# Patient Record
Sex: Female | Born: 1935 | Race: White | Hispanic: No | Smoking: Never smoker
Health system: Southern US, Community
[De-identification: ages and names within clinical notes are randomized; demographics above are authoritative.]

## PROBLEM LIST (undated history)

## (undated) DIAGNOSIS — M545 Low back pain, unspecified: Secondary | ICD-10-CM

## (undated) DIAGNOSIS — M1990 Unspecified osteoarthritis, unspecified site: Secondary | ICD-10-CM

## (undated) DIAGNOSIS — F419 Anxiety disorder, unspecified: Secondary | ICD-10-CM

## (undated) DIAGNOSIS — C801 Malignant (primary) neoplasm, unspecified: Secondary | ICD-10-CM

## (undated) DIAGNOSIS — F329 Major depressive disorder, single episode, unspecified: Secondary | ICD-10-CM

## (undated) DIAGNOSIS — R51 Headache: Secondary | ICD-10-CM

## (undated) DIAGNOSIS — R233 Spontaneous ecchymoses: Secondary | ICD-10-CM

## (undated) DIAGNOSIS — R238 Other skin changes: Secondary | ICD-10-CM

## (undated) DIAGNOSIS — E785 Hyperlipidemia, unspecified: Secondary | ICD-10-CM

## (undated) DIAGNOSIS — K219 Gastro-esophageal reflux disease without esophagitis: Secondary | ICD-10-CM

## (undated) DIAGNOSIS — I10 Essential (primary) hypertension: Secondary | ICD-10-CM

## (undated) DIAGNOSIS — F32A Depression, unspecified: Secondary | ICD-10-CM

## (undated) DIAGNOSIS — R519 Headache, unspecified: Secondary | ICD-10-CM

## (undated) HISTORY — DX: Anxiety disorder, unspecified: F41.9

## (undated) HISTORY — DX: Hyperlipidemia, unspecified: E78.5

## (undated) HISTORY — PX: JOINT REPLACEMENT: SHX530

## (undated) HISTORY — DX: Gastro-esophageal reflux disease without esophagitis: K21.9

## (undated) HISTORY — PX: HIP ARTHROPLASTY: SHX981

## (undated) HISTORY — DX: Depression, unspecified: F32.A

## (undated) HISTORY — PX: BLADDER SURGERY: SHX569

## (undated) HISTORY — DX: Major depressive disorder, single episode, unspecified: F32.9

## (undated) HISTORY — PX: CATARACT EXTRACTION: SUR2

## (undated) HISTORY — PX: CHOLECYSTECTOMY: SHX55

---

## 2005-04-16 ENCOUNTER — Ambulatory Visit: Payer: Self-pay | Admitting: Internal Medicine

## 2006-01-20 ENCOUNTER — Ambulatory Visit: Payer: Self-pay | Admitting: Internal Medicine

## 2006-04-17 ENCOUNTER — Ambulatory Visit: Payer: Self-pay | Admitting: Internal Medicine

## 2006-07-28 ENCOUNTER — Ambulatory Visit: Payer: Self-pay | Admitting: Unknown Physician Specialty

## 2006-08-11 ENCOUNTER — Ambulatory Visit: Payer: Self-pay

## 2007-06-23 ENCOUNTER — Ambulatory Visit: Payer: Self-pay | Admitting: Internal Medicine

## 2008-06-23 ENCOUNTER — Ambulatory Visit: Payer: Self-pay | Admitting: Internal Medicine

## 2008-10-15 ENCOUNTER — Ambulatory Visit: Payer: Self-pay

## 2008-10-20 ENCOUNTER — Ambulatory Visit: Payer: Self-pay | Admitting: Internal Medicine

## 2008-11-08 ENCOUNTER — Ambulatory Visit: Payer: Self-pay | Admitting: Surgery

## 2008-11-15 ENCOUNTER — Ambulatory Visit: Payer: Self-pay | Admitting: Surgery

## 2008-11-28 ENCOUNTER — Ambulatory Visit: Payer: Self-pay | Admitting: Pain Medicine

## 2008-12-01 ENCOUNTER — Ambulatory Visit: Payer: Self-pay | Admitting: Pain Medicine

## 2008-12-15 ENCOUNTER — Ambulatory Visit: Payer: Self-pay | Admitting: Physician Assistant

## 2009-04-11 ENCOUNTER — Inpatient Hospital Stay (HOSPITAL_COMMUNITY): Admission: RE | Admit: 2009-04-11 | Discharge: 2009-04-13 | Payer: Self-pay | Admitting: Orthopedic Surgery

## 2009-08-30 ENCOUNTER — Ambulatory Visit: Payer: Self-pay | Admitting: Internal Medicine

## 2009-09-11 ENCOUNTER — Ambulatory Visit: Payer: Self-pay | Admitting: Unknown Physician Specialty

## 2010-09-14 ENCOUNTER — Ambulatory Visit: Payer: Self-pay | Admitting: Internal Medicine

## 2010-09-24 ENCOUNTER — Ambulatory Visit: Payer: Self-pay | Admitting: Unknown Physician Specialty

## 2010-10-11 ENCOUNTER — Ambulatory Visit: Payer: Self-pay | Admitting: Unknown Physician Specialty

## 2010-10-18 ENCOUNTER — Ambulatory Visit: Payer: Self-pay | Admitting: Unknown Physician Specialty

## 2010-10-26 LAB — CBC
HCT: 43.8 % (ref 36.0–46.0)
Hemoglobin: 10.1 g/dL — ABNORMAL LOW (ref 12.0–15.0)
Hemoglobin: 14.9 g/dL (ref 12.0–15.0)
MCHC: 33.8 g/dL (ref 30.0–36.0)
MCHC: 34.5 g/dL (ref 30.0–36.0)
MCHC: 33.9 g/dL (ref 30.0–36.0)
MCV: 96.6 fL (ref 78.0–100.0)
MCV: 95.6 fL (ref 78.0–100.0)
Platelets: 150 10*3/uL (ref 150–400)
Platelets: 251 10*3/uL (ref 150–400)
RBC: 3.03 MIL/uL — ABNORMAL LOW (ref 3.87–5.11)
RBC: 3.28 MIL/uL — ABNORMAL LOW (ref 3.87–5.11)
RBC: 4.59 MIL/uL (ref 3.87–5.11)
RDW: 14 % (ref 11.5–15.5)
WBC: 10.2 10*3/uL (ref 4.0–10.5)
WBC: 9.6 10*3/uL (ref 4.0–10.5)
WBC: 11.9 10*3/uL — ABNORMAL HIGH (ref 4.0–10.5)

## 2010-10-26 LAB — URINALYSIS, ROUTINE W REFLEX MICROSCOPIC
Glucose, UA: NEGATIVE mg/dL
Hgb urine dipstick: NEGATIVE
Specific Gravity, Urine: 1.019 (ref 1.005–1.030)
pH: 6 (ref 5.0–8.0)

## 2010-10-26 LAB — BASIC METABOLIC PANEL
BUN: 7 mg/dL (ref 6–23)
BUN: 9 mg/dL (ref 6–23)
BUN: 22 mg/dL (ref 6–23)
CO2: 28 mEq/L (ref 19–32)
CO2: 29 mEq/L (ref 19–32)
CO2: 25 mEq/L (ref 19–32)
Calcium: 7.8 mg/dL — ABNORMAL LOW (ref 8.4–10.5)
Calcium: 7.9 mg/dL — ABNORMAL LOW (ref 8.4–10.5)
Calcium: 9.4 mg/dL (ref 8.4–10.5)
Chloride: 103 mEq/L (ref 96–112)
Chloride: 104 mEq/L (ref 96–112)
Chloride: 103 mEq/L (ref 96–112)
Creatinine, Ser: 0.75 mg/dL (ref 0.4–1.2)
Creatinine, Ser: 0.8 mg/dL (ref 0.4–1.2)
Creatinine, Ser: 0.84 mg/dL (ref 0.4–1.2)
GFR calc Af Amer: >60 mL/min (ref >60)
GFR calc Af Amer: >60 mL/min (ref >60)
GFR calc Af Amer: >60 mL/min (ref >60)
GFR calc non Af Amer: >60 mL/min (ref >60)
GFR calc non Af Amer: >60 mL/min (ref >60)
GFR calc non Af Amer: >60 mL/min (ref >60)
Glucose, Bld: 123 mg/dL — ABNORMAL HIGH (ref 70–99)
Glucose, Bld: 126 mg/dL — ABNORMAL HIGH (ref 70–99)
Glucose, Bld: 128 mg/dL — ABNORMAL HIGH (ref 70–99)
Potassium: 3.3 mEq/L — ABNORMAL LOW (ref 3.5–5.1)
Potassium: 3.5 mEq/L (ref 3.5–5.1)
Potassium: 3.3 mEq/L — ABNORMAL LOW (ref 3.5–5.1)
Sodium: 135 mEq/L (ref 135–145)
Sodium: 136 mEq/L (ref 135–145)
Sodium: 139 mEq/L (ref 135–145)

## 2010-10-26 LAB — TYPE AND SCREEN
ABO/RH(D): O POS
Antibody Screen: NEGATIVE

## 2010-10-26 LAB — DIFFERENTIAL
Eosinophils Absolute: 0 10*3/uL (ref 0.0–0.7)
Lymphs Abs: 1.9 10*3/uL (ref 0.7–4.0)
Monocytes Absolute: 0.8 10*3/uL (ref 0.1–1.0)
Monocytes Relative: 7 % (ref 3–12)
Neutrophils Relative %: 77 % (ref 43–77)

## 2010-10-26 LAB — APTT: aPTT: 23 seconds — ABNORMAL LOW (ref 24–37)

## 2010-10-26 LAB — PROTIME-INR
INR: 0.9 (ref 0.00–1.49)
Prothrombin Time: 12.2 seconds (ref 11.6–15.2)

## 2010-10-26 LAB — ABO/RH: ABO/RH(D): O POS

## 2010-11-11 IMAGING — NM NUCLEAR MEDICINE HEPATOHBILIARY INCLUDE GB
2 series · 12 of 12 positions shown · non-contrast
Comparison: none

REASON FOR EXAM: RUQ pain eval gallbladder
COMMENTS:

[Series 1000: gallbladder dynamic (results) · 4.80mm/px · 6 of 60 frames shown]
[frame 6/60]
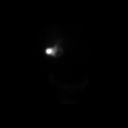
[frame 16/60]
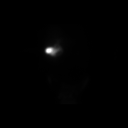
[frame 26/60]
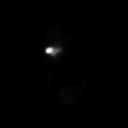
[frame 36/60]
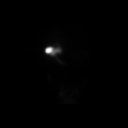
[frame 46/60]
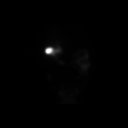
[frame 56/60]
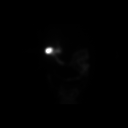

[Series 1000: gallbladder dynamic · 4.80mm/px · 6 of 60 frames shown]
[frame 6/60]
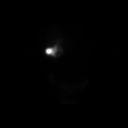
[frame 16/60]
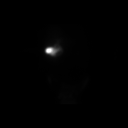
[frame 26/60]
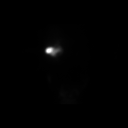
[frame 36/60]
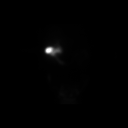
[frame 46/60]
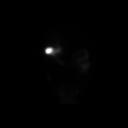
[frame 56/60]
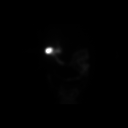

[12 of 12 positions shown; findings below may reference images not displayed]

PROCEDURE:     NM  - NM HEPATO WITH GB EJECT FRACTION  - October 20, 2008 [DATE]

RESULT:     The patient received an injection of 8.55 mCi of technetium 99m
Choletec. There is prompt extraction of tracer from the blood pool by the
liver. The common bile duct activity is seen by 10 minutes with small bowel
activity present by 15 minutes. Gallbladder localization begins at
approximately 30 minutes. There is progressive clearance of tracer from the
liver throughout the exam. The patient received an infusion of sincalide
with a total dose of 1.54 mcg administered intravenously over 30 minutes.
There is an abnormal, low gallbladder ejection fraction of 15%.
IMPRESSION: 1. Normal-appearing hepatobiliary scan.

2. Abnormal gallbladder ejection fraction in response to sincalide infusion.
The gallbladder ejection fraction is measured at 15%.

## 2010-12-12 ENCOUNTER — Ambulatory Visit: Payer: Self-pay | Admitting: Unknown Physician Specialty

## 2011-10-10 ENCOUNTER — Ambulatory Visit: Payer: Self-pay | Admitting: Internal Medicine

## 2012-04-10 ENCOUNTER — Ambulatory Visit: Payer: Self-pay | Admitting: Physical Medicine and Rehabilitation

## 2012-10-12 ENCOUNTER — Ambulatory Visit: Payer: Self-pay | Admitting: Internal Medicine

## 2013-07-26 ENCOUNTER — Ambulatory Visit: Payer: Self-pay | Admitting: Internal Medicine

## 2013-10-05 ENCOUNTER — Ambulatory Visit: Payer: Self-pay | Admitting: General Practice

## 2013-10-05 LAB — URINALYSIS, COMPLETE
BILIRUBIN, UR: NEGATIVE
Blood: NEGATIVE
Glucose,UR: NEGATIVE mg/dL (ref 0–75)
Ketone: NEGATIVE
Nitrite: NEGATIVE
PH: 5 (ref 4.5–8.0)
Protein: NEGATIVE
RBC,UR: 3 /HPF (ref 0–5)
Specific Gravity: 1.008 (ref 1.003–1.030)
Squamous Epithelial: <1

## 2013-10-05 LAB — CBC
HCT: 45.6 % (ref 35.0–47.0)
HGB: 15.3 g/dL (ref 12.0–16.0)
MCH: 30.8 pg (ref 26.0–34.0)
MCHC: 33.5 g/dL (ref 32.0–36.0)
MCV: 92 fL (ref 80–100)
Platelet: 170 10*3/uL (ref 150–440)
RBC: 4.97 10*6/uL (ref 3.80–5.20)
RDW: 13.9 % (ref 11.5–14.5)
WBC: 8.8 10*3/uL (ref 3.6–11.0)

## 2013-10-05 LAB — MRSA PCR SCREENING

## 2013-10-05 LAB — SEDIMENTATION RATE: ERYTHROCYTE SED RATE: 1 mm/h (ref 0–30)

## 2013-10-07 LAB — URINE CULTURE

## 2013-10-13 ENCOUNTER — Ambulatory Visit: Payer: Self-pay | Admitting: Internal Medicine

## 2013-10-20 ENCOUNTER — Inpatient Hospital Stay: Payer: Self-pay | Admitting: General Practice

## 2013-10-21 LAB — BASIC METABOLIC PANEL
ANION GAP: 4 — AB (ref 7–16)
BUN: 12 mg/dL (ref 7–18)
CHLORIDE: 105 mmol/L (ref 98–107)
CREATININE: 0.96 mg/dL (ref 0.60–1.30)
Calcium, Total: 8.1 mg/dL — ABNORMAL LOW (ref 8.5–10.1)
Co2: 28 mmol/L (ref 21–32)
GFR CALC NON AF AMER: 56 — AB
Glucose: 118 mg/dL — ABNORMAL HIGH (ref 65–99)
Osmolality: 275 (ref 275–301)
POTASSIUM: 4.1 mmol/L (ref 3.5–5.1)
SODIUM: 137 mmol/L (ref 136–145)

## 2013-10-21 LAB — PLATELET COUNT: PLATELETS: 142 10*3/uL — AB (ref 150–440)

## 2013-10-21 LAB — HEMOGLOBIN: HGB: 12.1 g/dL (ref 12.0–16.0)

## 2013-10-22 LAB — BASIC METABOLIC PANEL
ANION GAP: 5 — AB (ref 7–16)
BUN: 13 mg/dL (ref 7–18)
CREATININE: 0.97 mg/dL (ref 0.60–1.30)
Calcium, Total: 8.2 mg/dL — ABNORMAL LOW (ref 8.5–10.1)
Chloride: 106 mmol/L (ref 98–107)
Co2: 27 mmol/L (ref 21–32)
EGFR (African American): >60
EGFR (Non-African Amer.): 56 — ABNORMAL LOW
GLUCOSE: 110 mg/dL — AB (ref 65–99)
OSMOLALITY: 276 (ref 275–301)
POTASSIUM: 3.8 mmol/L (ref 3.5–5.1)
Sodium: 138 mmol/L (ref 136–145)

## 2013-10-22 LAB — HEMOGLOBIN: HGB: 11.6 g/dL — AB (ref 12.0–16.0)

## 2013-10-22 LAB — PLATELET COUNT: PLATELETS: 127 10*3/uL — AB (ref 150–440)

## 2013-12-20 ENCOUNTER — Ambulatory Visit: Payer: Self-pay | Admitting: General Practice

## 2013-12-30 DIAGNOSIS — M48062 Spinal stenosis, lumbar region with neurogenic claudication: Secondary | ICD-10-CM | POA: Insufficient documentation

## 2014-01-07 ENCOUNTER — Ambulatory Visit: Payer: Self-pay | Admitting: Physical Medicine and Rehabilitation

## 2014-01-11 DIAGNOSIS — M1990 Unspecified osteoarthritis, unspecified site: Secondary | ICD-10-CM | POA: Insufficient documentation

## 2014-06-22 ENCOUNTER — Ambulatory Visit: Payer: Self-pay | Admitting: General Practice

## 2014-06-22 LAB — BASIC METABOLIC PANEL
Anion Gap: 4 — ABNORMAL LOW (ref 7–16)
BUN: 16 mg/dL (ref 7–18)
CO2: 31 mmol/L (ref 21–32)
Calcium, Total: 9.1 mg/dL (ref 8.5–10.1)
Chloride: 102 mmol/L (ref 98–107)
Creatinine: 0.89 mg/dL (ref 0.60–1.30)
EGFR (African American): >60
EGFR (Non-African Amer.): >60
GLUCOSE: 101 mg/dL — AB (ref 65–99)
Osmolality: 275 (ref 275–301)
Potassium: 3.4 mmol/L — ABNORMAL LOW (ref 3.5–5.1)
Sodium: 137 mmol/L (ref 136–145)

## 2014-06-22 LAB — URINALYSIS, COMPLETE
Bacteria: NONE SEEN
Bilirubin,UR: NEGATIVE
Blood: NEGATIVE
Glucose,UR: NEGATIVE mg/dL (ref 0–75)
KETONE: NEGATIVE
LEUKOCYTE ESTERASE: NEGATIVE
Nitrite: NEGATIVE
PH: 5 (ref 4.5–8.0)
Protein: NEGATIVE
SPECIFIC GRAVITY: 1.005 (ref 1.003–1.030)
WBC UR: <1 /HPF (ref 0–5)

## 2014-06-22 LAB — CBC
HCT: 46.9 % (ref 35.0–47.0)
HGB: 15.1 g/dL (ref 12.0–16.0)
MCH: 29.8 pg (ref 26.0–34.0)
MCHC: 32.1 g/dL (ref 32.0–36.0)
MCV: 93 fL (ref 80–100)
PLATELETS: 188 10*3/uL (ref 150–440)
RBC: 5.05 10*6/uL (ref 3.80–5.20)
RDW: 14.2 % (ref 11.5–14.5)
WBC: 6.4 10*3/uL (ref 3.6–11.0)

## 2014-06-22 LAB — APTT: ACTIVATED PTT: 28 s (ref 23.6–35.9)

## 2014-06-22 LAB — PROTIME-INR
INR: 0.9
Prothrombin Time: 12.2 secs (ref 11.5–14.7)

## 2014-06-22 LAB — SEDIMENTATION RATE: ERYTHROCYTE SED RATE: 8 mm/h (ref 0–30)

## 2014-06-22 LAB — MRSA PCR SCREENING

## 2014-06-23 LAB — URINE CULTURE

## 2014-07-04 ENCOUNTER — Inpatient Hospital Stay: Payer: Self-pay | Admitting: General Practice

## 2014-07-05 LAB — BASIC METABOLIC PANEL
Anion Gap: 8 (ref 7–16)
BUN: 13 mg/dL (ref 7–18)
Calcium, Total: 7.7 mg/dL — ABNORMAL LOW (ref 8.5–10.1)
Chloride: 104 mmol/L (ref 98–107)
Co2: 25 mmol/L (ref 21–32)
Creatinine: 1.19 mg/dL (ref 0.60–1.30)
EGFR (African American): 56 — ABNORMAL LOW
GFR CALC NON AF AMER: 47 — AB
Glucose: 102 mg/dL — ABNORMAL HIGH (ref 65–99)
Osmolality: 274 (ref 275–301)
POTASSIUM: 3.6 mmol/L (ref 3.5–5.1)
SODIUM: 137 mmol/L (ref 136–145)

## 2014-07-05 LAB — PLATELET COUNT: PLATELETS: 132 10*3/uL — AB (ref 150–440)

## 2014-07-05 LAB — HEMOGLOBIN: HGB: 11.1 g/dL — AB (ref 12.0–16.0)

## 2014-07-06 LAB — BASIC METABOLIC PANEL
Anion Gap: 5 — ABNORMAL LOW (ref 7–16)
BUN: 10 mg/dL (ref 7–18)
CALCIUM: 7.7 mg/dL — AB (ref 8.5–10.1)
Chloride: 109 mmol/L — ABNORMAL HIGH (ref 98–107)
Co2: 24 mmol/L (ref 21–32)
Creatinine: 0.76 mg/dL (ref 0.60–1.30)
EGFR (African American): >60
EGFR (Non-African Amer.): >60
Glucose: 100 mg/dL — ABNORMAL HIGH (ref 65–99)
OSMOLALITY: 275 (ref 275–301)
Potassium: 3.7 mmol/L (ref 3.5–5.1)
Sodium: 138 mmol/L (ref 136–145)

## 2014-07-06 LAB — PLATELET COUNT: PLATELETS: 117 10*3/uL — AB (ref 150–440)

## 2014-07-06 LAB — HEMOGLOBIN: HGB: 11.2 g/dL — AB (ref 12.0–16.0)

## 2014-09-15 DIAGNOSIS — G2581 Restless legs syndrome: Secondary | ICD-10-CM | POA: Insufficient documentation

## 2014-11-12 NOTE — Op Note (Signed)
PATIENT NAME:  Traci [name], MILLINER MR#:  [id number] DATE OF BIRTH:  [birth year]  DATE OF PROCEDURE:  07/04/2014  PREOPERATIVE DIAGNOSIS: Degenerative arthrosis of the right hip (primary).   POSTOPERATIVE DIAGNOSIS: Degenerative arthrosis of the right hip (primary).   PROCEDURE PERFORMED: Right total hip arthroplasty.   SURGEON: Laurice Record. Holley Bouche., MD   ASSISTANT: Vance Peper, PA (required to maintain retraction throughout the procedure).   ANESTHESIA: Spinal.   ESTIMATED BLOOD LOSS: 270 mL   FLUIDS REPLACED: 1800 mL of crystalloid.   DRAINS: Two medium drains to Hemovac reservoir.   IMPLANTS UTILIZED: DePuy 15 mm small stature AML femoral stem, 52 mm outer diameter Pinnacle 100 acetabular shell, neutral Pinnacle AltrX polyethylene liner, and a 36 mm M-SPEC femoral head with a -2 mm neck length.   INDICATIONS FOR SURGERY: The patient is a 81 year old female who has been seen for complaints of severe progressive right hip and groin pain. X-rays demonstrated significant degenerative changes. After discussion of the risks and benefits of surgical intervention, the patient expressed understanding of the risks and benefits, and agreed with plans for surgical intervention.   PROCEDURE IN DETAIL: The patient was brought into the Operating Room and, after adequate spinal anesthesia was achieved, the patient was placed in the left lateral decubitus position. Axillary roll was placed, and all bony prominences were well padded. The patient's right hip and leg were cleaned and prepped with alcohol and DuraPrep and draped in the usual sterile fashion. A "timeout" was performed as per usual protocol.   A lateral curvilinear incision was made, gently curving towards the posterior superior iliac spine. IT band was incised in line with the skin incision. Fibers of the gluteus maximus were split in line. Piriformis tendon was identified, skeletonized, and incised at its insertion on the proximal femur and  reflected posteriorly. In a similar fashion, the short external rotators were incised and reflected posteriorly. A T-type posterior capsulotomy was performed. Prior to dislocation of the femoral head, a threaded Steinmann pin was inserted through a separate stab incision into the pelvis superior to the acetabulum in the form of a stylus so as to assess limb length and hip offset throughout the procedure.   The femoral head was then dislocated posteriorly. Severe degenerative changes were noted most notably to the superior aspect. A femoral neck cut was performed using an oscillating saw. The anterior capsule was elevated off the femoral neck. Inspection of the acetabulum also demonstrated degenerative changes. The remnant of the labrum was excised using electrocautery. The acetabulum was reamed in a sequential fashion up to a 51 mm diameter. This allowed for good punctate bleeding bone. A 52 mm outer diameter Pinnacle 100 acetabular component was positioned and impacted into place. Good scratch fit was appreciated. A +4 mm neutral polyethylene trial was inserted, and attention was directed to the proximal femur. Pilot hole for reaming of the proximal femoral canal was created using a high speed bur. Proximal femoral canal was reamed in a sequential fashion up to a 14.5 mm diameter. This allowed for approximately 6 cm of scratch fit. The proximal femur was prepared using a 15 mm aggressive side-biting reamer. Serial broaches were inserted up to a 15 mm small stature AML broach. The calcar region was planed and trial reduction was performed with a 36 mm hip ball with +1.5 mm neck length. Significant increase in hip offset was noted. The +4 mm neutral polyethylene trial was replaced with a neutral polyethylene trial, and the head was  replaced with a 36 mm trial hip ball with a -2 mm neck length. This allowed for good equalization of limb lengths and improved hip offset. Excellent stability was noted both anteriorly  and posteriorly.   Trial components were removed. The acetabular shell was irrigated and suctioned dry. A neutral Pinnacle AltrX polyethylene liner was positioned and impacted into place. Next, a 15 mm small stature AML femoral component was positioned and impacted into place. Excellent scratch fit was appreciated. Trial reduction was again performed with a 36 mm hip ball with a -2 mm neck length. Again, excellent stability was appreciated both anteriorly and posteriorly, and appropriate hip offset and limb length was noted. Trial hip ball was removed. The Morse taper was cleaned and dried. A 36 mm M-SPEC femoral head with a -2 mm neck length was placed on the trunnion and impacted into place. The hip was reduced and placed through a range of motion, again with excellent stability and good equalization of limb lengths.   The wound was irrigated with copious amounts of normal saline with antibiotic solution using pulsatile lavage and then suctioned dry. Good hemostasis was appreciated. The posterior capsulotomy was repaired using #5 Ethibond. The piriformis tendon was reapproximated on the undersurface of the gluteus medius tendon using #5 Ethibond. Two medium drains were placed in the wound bed and brought out through a separate stab incision to be attached to a Hemovac reservoir. The IT band was repaired using interrupted sutures of #1 Vicryl. The subcutaneous tissue was approximated in layers using first #0 Vicryl followed by #2-0 Vicryl. Skin was closed with skin staples. A sterile dressing was applied.   The patient tolerated the procedure well. She was transported to the recovery room in stable condition.     ____________________________ Laurice Record. Holley Bouche., MD jph:MT D: 07/05/2014 06:22:00 ET T: 07/05/2014 11:05:19 ET JOB#: [id number]  cc: Laurice Record. Holley Bouche., MD, <Dictator> Laurice Record Holley Bouche MD ELECTRONICALLY SIGNED 07/05/2014 19:47

## 2014-11-12 NOTE — Op Note (Signed)
PATIENT NAME:  Traci [name], Traci [name] MR#:  [id number] DATE OF BIRTH:  August 25, 1932  DATE OF PROCEDURE:  10/20/2013  PREOPERATIVE DIAGNOSIS: Degenerative arthrosis of the right knee.   POSTOPERATIVE DIAGNOSIS: Degenerative arthrosis of the right knee.   PROCEDURE PERFORMED: Right total knee arthroplasty using computer-assisted navigation.   SURGEON: Laurice Record. Hooten, M.D.   ASSISTANT:  Vance Peper, PA (required to maintain retraction throughout the procedure).  ANESTHESIA: General.   ESTIMATED BLOOD LOSS: 75 mL.   FLUIDS REPLACED: 1800 mL of crystalloid.   TOURNIQUET TIME: 83 minutes.   DRAINS: Two medium drains to reinfusion system.   SOFT TISSUE RELEASES: Anterior cruciate ligament, posterior cruciate ligament, deep medial collateral ligament and patellofemoral ligament.   IMPLANTS UTILIZED: DePuy PFC Sigma size 4N (narrow) posterior stabilized femoral component (cemented), size 4 MBT tibial component (cemented), 32 mm 3-peg oval dome patella (cemented) and a 10 mm stabilized rotating platform polyethylene insert.   INDICATIONS FOR SURGERY: The patient is a 81 year old female who has been seen for complaints of progressive right knee pain. X-rays demonstrated severe degenerative changes in a tricompartmental fashion with relative valgus deformity. After discussion of the risks and benefits of surgical intervention, the patient expressed understanding of the risks and benefits, and agreed with plans for surgical intervention.   PROCEDURE IN DETAIL: The patient was brought to the Operating Room and adequate general anesthesia was achieved after failed attempts at spinal. A tourniquet was placed on the patient's upper right thigh and leg was cleaned and prepped with alcohol and DuraPrep and draped in the usual sterile fashion. A "timeout" was performed as per usual protocol. The right lower extremity was exsanguinated using an Esmarch and the tourniquet was inflated to 300 mmHg.   An anterior  longitudinal incision was made followed by a standard mid vastus approach. A small effusion was evacuated. The deep fibers of the medial collateral ligament were elevated in a subperiosteal fashion off the medial flare of the tibia so as maintain a continuous soft tissue sleeve. The patella was subluxed laterally and the patellofemoral ligament was incised. Inspection of the knee demonstrated severe degenerative changes with full-thickness loss of articular cartilage to the lateral compartment. Prominent osteophytes were debrided using a rongeur. Anterior and posterior cruciate ligaments were excised. Two 4.0 mm Schanz pins were inserted into the femur and into the tibia for attachment of the ray of trackers used for computer-assisted navigation. Hip center was identified using circumduction technique. Distal landmarks were mapped using the computer. The distal femur and proximal tibia were mapped using the computer. Distal femoral cutting guide was positioned using computer-assisted navigation so as to achieve a 5 degree distal valgus cut. Cut was performed and verified using the computer. Distal femur was sized and it was felt that a size 4N (narrow) size was appropriate. A size 4 cutting guide was positioned and the anterior cut was performed and verified using the computer. This was followed by completion of the posterior and chamfer cuts. Femoral cutting guide for the central box was then positioned and the central box cut was performed.   Next, attention was directed to the proximal tibia. Medial and lateral menisci were excised. The extramedullary tibial cutting guide was positioned using computer-assisted navigation so as to achieve a 0 degree varus-valgus alignment and 0 degree posterior slope. Cut was performed and verified using the computer. The proximal tibia was sized and it was felt that a size 4 tibial tray was appropriate. Tibial and femoral trials were inserted, followed  by insertion of a 10 mm  polyethylene trial. Excellent mediolateral soft tissue balancing was appreciated both in full extension and in flexion. Finally, the patella was cut and prepared so as to accommodate a 32 mm, 3-peg oval dome patella. Patellar trial was placed and the knee was placed through range of motion with excellent patellar tracking appreciated. The femoral trial was removed after debridement of posterior osteophytes. Central post hole for the tibial component was reamed followed by insertion of a keel punch. Tibial trial was then removed. The cut surfaces of bone were irrigated with copious amounts of normal saline with antibiotic solution using pulsatile lavage and then suctioned dry. Polymethyl methacrylate cement was prepared in the usual fashion using a vacuum mixer. Cement was applied to the cut surface of the proximal tibia as well as along the undersurface of a size 4 MBT tibial component. The tibial component was positioned and impacted into place. Excess cement was removed using [employer] fast streamer. Cement was then applied to the cut surface of the femur as well as along the posterior flanges of a size  4N (narrow) posterior stabilized femoral component. Femoral component was positioned and impacted into place. Excess cement was removed using [employer] fast streamer. A 10 mm polyethylene trial was inserted and the knee was brought in full extension with steady axial compression applied. Finally, cement was applied to the backside of a 32 mm, 3-peg oval dome patella and the patellar component was positioned and patellar clamp applied. Excess cement was removed using [employer] fast streamer.   After adequate curing of cement, the tourniquet was deflated after a total tourniquet time of 83 minutes. Hemostasis was achieved using electrocautery. The knee was irrigated with copious amounts of normal saline with antibiotic solution using pulsatile lavage and then suctioned dry. The knee was inspected for any residual cement debris. Then 20  mL of 1.3% Exparel in 40 mL of normal saline was injected along the posterior capsule, medial and lateral gutters, and along the arthrotomy site. A 10 mm stabilized rotating platform polyethylene insert was inserted and the knee was placed through a range of motion. Excellent mediolateral soft tissue balancing was appreciated. Excellent patellar tracking was noted. Two medium drains were placed in the wound bed and brought out through a separate stab incision to be attached to a reinfusion system. The medial parapatellar portion of the incision was reapproximated using interrupted sutures of #1 Vicryl. The subcutaneous tissue was approximated in layers using first #0 Vicryl followed by #2-0 Vicryl. Then 30 mL of 0.25% Marcaine with epinephrine was injected in the subcutaneous tissue in line with the skin incision. Skin was reapproximated using skin staples. A sterile dressing was applied.   The patient tolerated the procedure well. She was transported to the recovery room in stable condition.   ____________________________ Laurice Record. Holley Bouche., MD jph:cs D: 10/20/2013 15:42:03 ET T: 10/20/2013 19:46:21 ET JOB#: [id number]  cc: Jeneen Rinks P. Holley Bouche., MD, <Dictator> Laurice Record Holley Bouche MD ELECTRONICALLY SIGNED 10/21/2013 6:49

## 2014-11-12 NOTE — Discharge Summary (Signed)
PATIENT NAME:  Traci [name], Traci [name] MR#:  [id number] DATE OF BIRTH:  09-07-1932  DATE OF ADMISSION:  10/20/2013 DATE OF DISCHARGE:  10/23/2013   ADMITTING DIAGNOSIS: Degenerative arthrosis of the right knee.   DISCHARGE DIAGNOSIS: Degenerative arthrosis of the right knee.   OPERATION: On 10/20/2013, the patient had a right total knee arthroplasty using computer-aided navigation.   SURGEON: Laurice Record. Holley Bouche., MD  ASSISTANT: Vance Peper, PA   ANESTHESIA: General.   ESTIMATED BLOOD LOSS: 75 mL.   TOURNIQUET TIME: 83 minutes.   IMPLANTS USED: DePuy PFC Sigma size 4N narrow posterior stabilized femoral component that was cemented, size 4 MBT tibial component that was cemented, 32 mm 3 peg oval dome patella that was cemented, and a 10 mm stabilized rotating platform polyethylene insert.  The patient was stabilized and brought to the recovery room.   HISTORY: The patient is a 81 year old female who presented for an upcoming right total knee replacement. The patient has a long history of progressive pain and has been refractory to conservative treatment. The patient has tried cortisone injections, Synvisc injections and activity modification. The patient has been refractory to this care.   PHYSICAL EXAMINATION:  GENERAL: Alert female in no apparent distress. Ambulating with a valgus thrust to the right knee and an antalgic gait.  LUNGS: Clear to auscultation.  CARDIOVASCULAR: Regular rate and rhythm.  MUSCULOSKELETAL: In regard to the right lower extremity, the patient has mild effusion. The patient has lateral joint line tenderness. The patient has full extension to 122 degrees of flexion. The patient has good quadriceps control.   HOSPITAL COURSE: After initial admission on 10/20/2013, the patient was brought to the orthopedic floor. On postoperative day 1, the patient had a hemoglobin of 12.1, which dropped down to around 11.6 on postoperative day 2. The patient had an Autovac initially,  which was discontinued, and the patient tolerated this well. The patient worked with physical therapy, initially bed to chair and progressed up to ambulating 275 feet, including the stairs on the day before discharge. The patient was to go home based on her parameters. The patient did well and was ready to go home with no complication.   CONDITION AT DISCHARGE: Stable.   DISPOSITION: The patient was sent home with home health physical therapy.   DISCHARGE INSTRUCTIONS: The patient will follow up with [facility] on 11/04/2013. The patient will do weight bear as tolerated. The patient will raise her affected leg on 1 to 2 pillows on the bed. The patient will use thigh-high TED hose on both legs,  removed 1 hour every 8-hour shift. The patient will elevate her heels off the bed and do incentive spirometer while awake. The patient will be encouraged to do cough and deep breathing. The patient's diet is regular. The patient will use the Polar Care to decrease swelling. The patient will have a dressing change on a p.r.n. basis. The patient will call the clinic if there is any bright red bleeding, calf pain, bowel or bladder difficulty or fever greater than 101.5. The patient will work with home health physical therapy, working on gait training and range of motion activities.   DISCHARGE MEDICATIONS: Resume home medication and to add oxycodone 5 mg 1 to 2 tablets every 4 hours as needed for severe pain, Tylenol 500 mg 1 tablet every 4 hours as needed for pain or fever greater than 100.4, Celebrex 200 mg 1 capsule b.i.d., Lovenox 30 mg 1 injection daily for 14 days, then  discontinue and start on aspirin 81 mg once a day.    ____________________________ J. Reche Dixon, [place] jtm:lb D: 10/23/2013 50:41:36 ET T: 10/23/2013 06:49:00 ET JOB#: [id number]  cc: J. Reche Dixon, [place], <Dictator> J Kelisha Dall [facility] PA ELECTRONICALLY SIGNED 11/03/2013 6:37

## 2014-11-12 NOTE — Discharge Summary (Signed)
PATIENT NAME:  Traci, [name] MR#:  [id number] DATE OF BIRTH:  Jul 02, 1933  DATE OF ADMISSION:  07/04/2014 DATE OF DISCHARGE:  07/07/2014  ADMITTING DIAGNOSIS: Degenerative arthrosis of the right hip.   DISCHARGE DIAGNOSIS: Degenerative arthrosis of the right hip.   OPERATION: On 07/04/2014, the patient had a right total hip arthroplasty.   SURGEON: Laurice Record. Hooten, MD.   ASSISTANT: Vance Peper, PA.   ANESTHESIA: Spinal.   IMPLANTS USED: DePuy 15 mm small stature AML femoral stem, 52 mm outer diameter Pinnacle 100 acetabular shell, neutral Pinnacle AltrX polyethylene liner and a 36 mm M-Spec femoral head with -2 mm neck length. The patient was stabilized, brought to the recovery room, and then brought down to the orthopedic floor.   HISTORY OF PRESENT ILLNESS: The patient is a 81 year old female who presented for upcoming total hip replacement. The patient has had persistent pain and had difficulty with activities of daily living. The patient has had cortisone injections as well as activity modification, nonsteroidals with no relief of symptoms.   PHYSICAL EXAMINATION: GENERAL: Alert female with a slight antalgic gait and hip lurch on the right.  CARDIOVASCULAR: Regular rate and rhythm with no murmur.  LUNGS: Clear to auscultation.  MUSCULOSKELETAL: In regards to the right hip, the patient has no soft tissue swelling. The patient does have tenderness on the anterior hip joint, with discomfort with axial compression test and the extremes of motion. The patient has range of motion of full extension to 100 degrees flexion with 20 degrees abduction and neutral internal rotation with 30 degrees external rotation. NEUROVASCULAR: Essentially intact.   HOSPITAL COURSE: After initial admission on 07/04/2014, the patient was brought to the orthopedic floor. On postop day 1, the patient had a hemoglobin of 11.1, which remained stable to 11.2 on postop day 2. The patient worked with physical therapy.  Initially, bed to chair and progressed up to ambulating up to 300 feet, including stairs. The patient did well enough to go home on 07/07/2014.   CONDITION AT DISCHARGE: Stable.   DISPOSITION: The patient was sent home with home health physical therapy.   DISCHARGE INSTRUCTIONS: The patient will follow up on 08/16/2014 with Dr. Marry Guan. The patient will do weight bear as tolerated on the affected leg and use a walker. The patient will elevate her leg with 1 to 2 pillows to decrease swelling. The patient will use posterior hip precautions. The patient will use knee-high TED hose on both legs, removed at bedtime, and placed on again in the morning. The patient will elevate her heels off the bed and use incentive spirometer as well as be encouraged to do cough and deep breathing. The patient does have a diabetic diet. The patient will keep her dressing clean and dry and have it changed as needed with physical therapy. The patient will have the staples removed on December 28th with physical therapy. The patient will call the clinic if there is any bright red bleeding, calf pain, bowel or bladder difficulty, or any fever greater than 101.5.   DISCHARGE MEDICATIONS: Resume home medications and to add Lovenox 40 mg subcutaneous once a day for 14 days and then start aspirin 81 mg once a day, as well as Tylenol 500 mg 1 tablet every 4 hours as needed for fever, and oxycodone 5 mg 1 to 2 tablets every 4 hours as needed for severe pain. She is to use tramadol and will return to using this intermittently with oxycodone for milder pain.  ____________________________ Lenna Sciara. Reche Dixon, [place] jtm:mw D: 07/07/2014 06:29:16 ET T: 07/07/2014 11:33:54 ET JOB#: [id number]  cc: J. Reche Dixon, [place], <Dictator> J Jiah Bari [facility] PA ELECTRONICALLY SIGNED 07/08/2014 9:59

## 2014-11-14 LAB — SURGICAL PATHOLOGY

## 2014-11-28 ENCOUNTER — Other Ambulatory Visit: Payer: Self-pay

## 2014-11-28 DIAGNOSIS — Z1231 Encounter for screening mammogram for malignant neoplasm of breast: Secondary | ICD-10-CM

## 2014-12-29 ENCOUNTER — Other Ambulatory Visit: Payer: Self-pay

## 2014-12-29 ENCOUNTER — Other Ambulatory Visit: Payer: Self-pay | Admitting: Internal Medicine

## 2014-12-29 ENCOUNTER — Ambulatory Visit
Admission: RE | Admit: 2014-12-29 | Discharge: 2014-12-29 | Disposition: A | Discharge status: Home / Self Care | Payer: Medicare Other | Source: Ambulatory Visit | Attending: Internal Medicine | Admitting: Internal Medicine

## 2014-12-29 DIAGNOSIS — Z1231 Encounter for screening mammogram for malignant neoplasm of breast: Secondary | ICD-10-CM | POA: Diagnosis present

## 2014-12-29 HISTORY — DX: Malignant (primary) neoplasm, unspecified: C80.1

## 2015-03-06 ENCOUNTER — Other Ambulatory Visit: Payer: Self-pay | Admitting: Internal Medicine

## 2015-03-06 DIAGNOSIS — R1084 Generalized abdominal pain: Secondary | ICD-10-CM

## 2015-03-10 ENCOUNTER — Ambulatory Visit
Admission: RE | Admit: 2015-03-10 | Discharge: 2015-03-10 | Disposition: A | Discharge status: Home / Self Care | Payer: Medicare Other | Source: Ambulatory Visit | Attending: Internal Medicine | Admitting: Internal Medicine

## 2015-03-10 DIAGNOSIS — R1084 Generalized abdominal pain: Secondary | ICD-10-CM

## 2015-03-10 HISTORY — DX: Essential (primary) hypertension: I10

## 2015-03-10 MED ORDER — IOHEXOL 300 MG/ML  SOLN
100.0000 mL | Freq: Once | INTRAMUSCULAR | Status: AC | PRN
  Administered 2015-03-10: 80 mL via INTRAVENOUS

## 2015-04-19 DIAGNOSIS — K2950 Unspecified chronic gastritis without bleeding: Secondary | ICD-10-CM | POA: Insufficient documentation

## 2015-12-08 ENCOUNTER — Other Ambulatory Visit: Payer: Self-pay | Admitting: Internal Medicine

## 2015-12-08 DIAGNOSIS — Z1231 Encounter for screening mammogram for malignant neoplasm of breast: Secondary | ICD-10-CM

## 2016-01-01 ENCOUNTER — Other Ambulatory Visit: Payer: Self-pay | Admitting: Internal Medicine

## 2016-01-01 ENCOUNTER — Ambulatory Visit
Admission: RE | Admit: 2016-01-01 | Discharge: 2016-01-01 | Disposition: A | Discharge status: Home / Self Care | Payer: Medicare Other | Source: Ambulatory Visit | Attending: Internal Medicine | Admitting: Internal Medicine

## 2016-01-01 DIAGNOSIS — Z1231 Encounter for screening mammogram for malignant neoplasm of breast: Secondary | ICD-10-CM | POA: Diagnosis present

## 2016-04-10 DIAGNOSIS — Z0000 Encounter for general adult medical examination without abnormal findings: Secondary | ICD-10-CM | POA: Insufficient documentation

## 2016-04-16 DIAGNOSIS — N8110 Cystocele, unspecified: Secondary | ICD-10-CM | POA: Insufficient documentation

## 2016-12-31 ENCOUNTER — Other Ambulatory Visit: Payer: Self-pay | Admitting: Nurse Practitioner

## 2016-12-31 DIAGNOSIS — R1319 Other dysphagia: Secondary | ICD-10-CM

## 2017-01-06 ENCOUNTER — Ambulatory Visit
Admission: RE | Admit: 2017-01-06 | Discharge: 2017-01-06 | Disposition: A | Discharge status: Home / Self Care | Payer: Medicare Other | Source: Ambulatory Visit | Attending: Nurse Practitioner | Admitting: Nurse Practitioner

## 2017-01-06 DIAGNOSIS — K228 Other specified diseases of esophagus: Secondary | ICD-10-CM | POA: Diagnosis not present

## 2017-01-06 DIAGNOSIS — R1310 Dysphagia, unspecified: Secondary | ICD-10-CM | POA: Diagnosis present

## 2017-01-06 DIAGNOSIS — M2578 Osteophyte, vertebrae: Secondary | ICD-10-CM | POA: Diagnosis not present

## 2017-01-06 DIAGNOSIS — R1319 Other dysphagia: Secondary | ICD-10-CM

## 2017-01-16 ENCOUNTER — Other Ambulatory Visit: Payer: Self-pay | Admitting: Internal Medicine

## 2017-01-16 DIAGNOSIS — Z1231 Encounter for screening mammogram for malignant neoplasm of breast: Secondary | ICD-10-CM

## 2017-02-12 ENCOUNTER — Ambulatory Visit
Admission: RE | Admit: 2017-02-12 | Discharge: 2017-02-12 | Disposition: A | Discharge status: Home / Self Care | Payer: Medicare Other | Source: Ambulatory Visit | Attending: Internal Medicine | Admitting: Internal Medicine

## 2017-02-12 DIAGNOSIS — Z1231 Encounter for screening mammogram for malignant neoplasm of breast: Secondary | ICD-10-CM | POA: Insufficient documentation

## 2017-02-13 DIAGNOSIS — I10 Essential (primary) hypertension: Secondary | ICD-10-CM | POA: Insufficient documentation

## 2017-03-18 ENCOUNTER — Ambulatory Visit (INDEPENDENT_AMBULATORY_CARE_PROVIDER_SITE_OTHER): Payer: Medicare Other | Admitting: Urology

## 2017-03-18 ENCOUNTER — Encounter: Payer: Self-pay | Admitting: Urology

## 2017-03-18 VITALS — BP 163/98 | HR 96 | Ht 64.0 in | Wt 132.0 lb

## 2017-03-18 DIAGNOSIS — N952 Postmenopausal atrophic vaginitis: Secondary | ICD-10-CM

## 2017-03-18 DIAGNOSIS — N390 Urinary tract infection, site not specified: Secondary | ICD-10-CM | POA: Diagnosis not present

## 2017-03-18 DIAGNOSIS — R3915 Urgency of urination: Secondary | ICD-10-CM | POA: Diagnosis not present

## 2017-03-18 LAB — URINALYSIS, COMPLETE
BILIRUBIN UA: NEGATIVE
GLUCOSE, UA: NEGATIVE
Ketones, UA: NEGATIVE
Leukocytes, UA: NEGATIVE
Nitrite, UA: NEGATIVE
Protein, UA: NEGATIVE
Specific Gravity, UA: <1.005 — ABNORMAL LOW (ref 1.005–1.030)
UUROB: 0.2 mg/dL (ref 0.2–1.0)
pH, UA: 5.5 (ref 5.0–7.5)

## 2017-03-18 LAB — BLADDER SCAN AMB NON-IMAGING: SCAN RESULT: 20

## 2017-03-18 NOTE — Progress Notes (Signed)
03/18/2017 4:11 PM   Traci [name] May 08, 1933 [id number]  Referring provider: [name], MD [facility] [place], [place] [address]  Chief Complaint  Patient presents with  . Recurrent UTI    HPI: 83 year old female referred today for further evaluation of recurrent urinary tract infections.  She has seen multiple providers over the past year for similar issues.    She reports her issues started approximately one year ago. She was seen and evaluated by [name] of [facility] for significant pelvic organ prolapse.  She underwent hysteropexy, anterior colporrhaphy, mid-urethral sling and cysto on 04/23/2016.  This procedure corrected her prolapse and stress incontinence but she developed urinary urgency which is improved with the addition of Mybetriq. She's had follow-up cystoscopy and urodynamics since the procedure both of which are unremarkable.  Since about this time, she's been diagnosed with approximately 5 urinary tract infections. She reports her symptoms are primarily vaginal burning and irritation rather than urinary in origin. She denies any internal urethral or suprapubic pain with voiding. No fevers or chills. Her urinary urgency and urgency of subsided with the addition of Mybetriq.  Each time she is treated for UTI, she reports that she is essentially asymptomatic but a culture is positive. Her most recent positive urine culture was for Escherichia coli on 02/18/2017 for which she is completing a course of Macrobid. She's also been placed on suppressive Hipprex by her urogyn.    All urinalyses/urine culture obtained via patient collect voided sample.    She is also been seen and evaluated by Dr. [name] including pelvic exam. This revealed evidence of recurrent bacterial vaginosis for which she is now on suppressive metronidazole gel and advised to continue her topical estrogen cream at least twice a week. She is  worried that that estrogen cream is exacerbating her vaginal symptoms.  She did have a CT scan dating back to 10/2014 which shows bilateral peripelvic cysts from [facility] also appreciated CT up to 3.7 cm on 02/2015.  No stones.    PVR 20 cc.   PMH: Past Medical History:  Diagnosis Date  . Acid reflux   . Anxiety   . Cancer ([place]) skin   Squamous cell skin cancer  . Depression   . Hyperlipidemia   . Hypertension     Surgical History: Past Surgical History:  Procedure Laterality Date  . BLADDER SURGERY     Prolapse bladder, bladder sling surgery  . CATARACT EXTRACTION    . CHOLECYSTECTOMY    . HIP ARTHROPLASTY      Home Medications:  Allergies as of 03/18/2017   No Known Allergies     Medication List       Accurate as of 03/18/17  4:11 PM. Always use your most recent med list.          ALPRAZolam 0.25 MG tablet Commonly known as:  XANAX TAKE ONE TABLET BY MOUTH TWICE DAILY AS NEEDED   amLODipine 10 MG tablet Commonly known as:  NORVASC TAKE ONE (1) TABLET EACH DAY   conjugated estrogens vaginal cream Commonly known as:  PREMARIN Place vaginally.   DULoxetine 20 MG capsule Commonly known as:  CYMBALTA TAKE ONE (1) CAPSULE EACH DAY NERVES   furosemide 20 MG tablet Commonly known as:  LASIX TAKE ONE (1) TABLET EACH DAY FOR EDEMA   meloxicam 15 MG tablet Commonly known as:  MOBIC TAKE ONE TABLET BY MOUTH EVERY MORNING FOR ARTHRITIS   metroNIDAZOLE 0.75 %  vaginal gel Commonly known as:  METROGEL 1 applicator once a week x  6 months   mirabegron ER 50 MG Tb24 tablet Commonly known as:  MYRBETRIQ Take by mouth.   pantoprazole 40 MG tablet Commonly known as:  PROTONIX TAKE ONE TABLET BY MOUTH TWICE DAILY   pramipexole 0.5 MG tablet Commonly known as:  MIRAPEX TAKE ONE TABLET BY MOUTH EVERY NIGHT AT BEDTIME, MAY TAKE EXTRA 1/2 TABLET IF NEEDED.   ranitidine 150 MG tablet Commonly known as:  ZANTAC Take by mouth.   telmisartan 80 MG  tablet Commonly known as:  MICARDIS Take by mouth.   traMADol 50 MG tablet Commonly known as:  ULTRAM TAKE ONE TABLET BY MOUTH THREE TIMES A DAY AS NEEDED FOR PAIN   vitamin B-12 1000 MCG tablet Commonly known as:  CYANOCOBALAMIN Take by mouth.   vitamin C 1000 MG tablet Take by mouth.   Vitamin D 2000 units tablet Take by mouth.            Discharge Care Instructions        Start     Ordered   03/18/17 0000  Urinalysis, Complete     03/18/17 1537   03/18/17 0000  Bladder Scan (Post Void Residual) in office     03/18/17 1537      Allergies: No Known Allergies  Family History: Family History  Problem Relation Age of Onset  . Kidney disease Brother   . AAA (abdominal aortic aneurysm) Neg Hx     Social History:  reports that she has never smoked. She has never used smokeless tobacco. She reports that she does not drink alcohol or use drugs.  ROS: UROLOGY Frequent Urination?: No Hard to postpone urination?: No Burning/pain with urination?: No Get up at night to urinate?: Yes Leakage of urine?: No Urine stream starts and stops?: No Trouble starting stream?: No Do you have to strain to urinate?: No Blood in urine?: Yes Urinary tract infection?: Yes Sexually transmitted disease?: No Injury to kidneys or bladder?: No Painful intercourse?: No Weak stream?: No Currently pregnant?: No Vaginal bleeding?: No Last menstrual period?: n  Gastrointestinal Nausea?: No Vomiting?: No Indigestion/heartburn?: No Diarrhea?: No Constipation?: No  Constitutional Fever: No Night sweats?: No Weight loss?: No Fatigue?: No  Skin Skin rash/lesions?: No Itching?: No  Eyes Blurred vision?: No Double vision?: No  Ears/Nose/Throat Sore throat?: No Sinus problems?: Yes  Hematologic/Lymphatic Swollen glands?: No Easy bruising?: No  Cardiovascular Leg swelling?: No Chest pain?: No  Respiratory Cough?: No Shortness of breath?: No  Endocrine Excessive  thirst?: No  Musculoskeletal Back pain?: Yes Joint pain?: Yes  Neurological Headaches?: No Dizziness?: No  Psychologic Depression?: No Anxiety?: Yes  Physical Exam: BP (!) 163/98   Pulse 96   Ht 5\' 4"  (1.626 m)   Wt 132 lb (59.9 kg)   BMI 22.66 kg/m   Constitutional:  Alert and oriented, No acute distress. HEENT: [place] AT, moist mucus membranes.  Trachea midline, no masses. Cardiovascular: No clubbing, cyanosis, or edema. Respiratory: Normal respiratory effort, no increased work of breathing. GI: Abdomen is soft, nontender, nondistended, no abdominal masses GU: No CVA tenderness.  Skin: No rashes, bruises or suspicious lesions. Neurologic: Grossly intact, no focal deficits, moving all 4 extremities. Psychiatric: Normal mood and affect.  Laboratory Data: Lab Results  Component Value Date   WBC 6.4 06/22/2014   HGB 11.2 (L) 07/06/2014   HCT 46.9 06/22/2014   MCV 93 06/22/2014   PLT 117 (L) 07/06/2014  Lab Results  Component Value Date   CREATININE 0.76 07/06/2014   Urinalysis UA reviewed today, see EPIC  Pertinent Imaging: Results for orders placed or performed in visit on 03/18/17  Urinalysis, Complete  Result Value Ref Range   Specific Gravity, UA <1.005 (L) 1.005 - 1.030   pH, UA 5.5 5.0 - 7.5   Color, UA Yellow Yellow   Appearance Ur Clear Clear   Leukocytes, UA Negative Negative   Protein, UA Negative Negative/Trace   Glucose, UA Negative Negative   Ketones, UA Negative Negative   RBC, UA Trace (A) Negative   Bilirubin, UA Negative Negative   Urobilinogen, Ur 0.2 0.2 - 1.0 mg/dL   Nitrite, UA Negative Negative  Bladder Scan (Post Void Residual) in office  Result Value Ref Range   Scan Result 20     Assessment & Plan:    1. Recurrent UTI Lengthy conversation today about asymptomatic bacteriuria- if she is not having signs or symptoms of infection which were reviewed in detail, would void addition of antibiotics given concern for development of  antibiotic resistance and changes in her vaginal flora  Additionally, it appears that there are multiple providers involved in the care and treatment of her UTIs. I believe this is creating some confusion as well as frustration for the patient. I would like her to come here specifically if she is agreeable if she has symptoms consistent with urinary tract infection. We'll be able to accommodate her for same day visit on regular office hours with a nurse visit for a catheterized specimen and we'll treat as needed. If she is prescribed antibiotics by any other provider for asymptomatic bacteria in the urine, I would like her to run it by [place] prior to taking the antibiotic. She is agreeable to this plan.  I advised her to complete her current antibiotic course.  Given her issues with vaginal burning and dryness, urinary acidification with Hipprex may be exacerbating the symptoms. Advised her to stop this medication for the time being.  Recheck in 6 months. - Urinalysis, Complete - Bladder Scan (Post Void Residual) in office  2. Urinary urgency Continue Mybetriq as previously prescribed as this seems to be helping her urinary Adequate bladder emptying today on bladder scan  3. Atrophic vaginitis Continue topical estrogen cream at least twice weekly, concerned about cost- Will call in compounded formula to medical Pharmacy Continue metronidazole cream for total of 6 months per Dr. [name]   Return in about 6 months (around 09/18/2017) for recheck symptoms.  Hollice Espy, MD   I spent 45 min with this patient of which greater than 50% was spent in counseling and coordination of care with the patient.   Extensive review of records from care everywhere.   [facility] [address] [place], [place] [address] [phone]

## 2017-03-18 NOTE — Progress Notes (Signed)
Estradial 0.2%MG/GM Qty 30 Grams called in to [facility] with (1) refill.

## 2017-04-16 ENCOUNTER — Ambulatory Visit: Payer: Self-pay | Admitting: Urology

## 2017-07-23 ENCOUNTER — Telehealth: Payer: Self-pay | Admitting: Radiology

## 2017-07-23 NOTE — Telephone Encounter (Signed)
LMOM that appt has been moved from 07/24/17 to 07/25/17 @11 :30 per Dr Erlene Quan.

## 2017-07-24 ENCOUNTER — Encounter: Payer: Self-pay | Admitting: Urology

## 2017-07-24 ENCOUNTER — Ambulatory Visit: Payer: Medicare Other | Admitting: Urology

## 2017-07-24 VITALS — BP 151/94 | HR 98 | Ht 64.0 in | Wt 160.0 lb

## 2017-07-24 DIAGNOSIS — N390 Urinary tract infection, site not specified: Secondary | ICD-10-CM | POA: Diagnosis not present

## 2017-07-24 DIAGNOSIS — N952 Postmenopausal atrophic vaginitis: Secondary | ICD-10-CM

## 2017-07-24 DIAGNOSIS — R3915 Urgency of urination: Secondary | ICD-10-CM

## 2017-07-24 NOTE — Progress Notes (Signed)
07/24/2017 11:25 AM   Traci [name] 1932-09-01 [id number]  Referring provider: [name], MD [facility] [place], [place] [address]  Chief Complaint  Patient presents with  . Other    HPI: 84 year old female seen today earlier than anticipated for vaginal complaints.  She reports that over the past 2 weeks, she is developed significant vaginal and labial irritation which she describes as a constant pinching feeling.  Denies any significant discharge or itching.  This is not alleviated or exacerbated by anything.   Since her discomfort began, she is tried a and D ointment, Desitin, and a topical antifungal cream without any relief.  She denies any urinary symptoms without urgency, frequency, dysuria, or gross hematuria.  No UTIs since her last visit.  She does have chronic low back pain.  Denies flank pain, fevers, or chills.  At the time of her last visit, she was supposed to be on MetroGel for 6 months for chronic bacterial vaginosis.  She reports that on 02/2017, she stopped this medication prematurely and cannot recall why.  She has been using her topical estrogen cream   At least twice a week as previously recommended.  Previous history: She was seen and evaluated by [name] of [facility] u[facility]ne[facility] for significant pelvic organ prolapse.  She underwent hysteropexy, anterior colporrhaphy, mid-urethral sling and cysto on 04/23/2016.  This procedure corrected her prolapse and stress incontinence but she developed urinary urgency which is improved with the addition of Mybetriq. She's had follow-up cystoscopy and urodynamics since the procedure both of which are unremarkable.  Since about this time, she's been diagnosed with approximately 5 urinary tract infections. She reports her symptoms are primarily vaginal burning and irritation rather than urinary in origin. She denies any internal urethral or suprapubic pain with voiding. No  fevers or chills. Her urinary urgency and urgency of subsided with the addition of Mybetriq.  Each time she is treated for UTI, she reports that she is essentially asymptomatic but a culture is positive. Her most recent positive urine culture was for Escherichia coli on 02/18/2017 for which she is completing a course of Macrobid. She's also been placed on suppressive Hipprex by her urogyn.    All urinalyses/urine culture obtained via patient collect voided sample.    She is also been seen and evaluated by Dr. [name] including pelvic exam. This revealed evidence of recurrent bacterial vaginosis for which she is now on suppressive metronidazole gel and advised to continue her topical estrogen cream at least twice a week. She is worried that that estrogen cream is exacerbating her vaginal symptoms.  She did have a CT scan dating back to 10/2014 which shows bilateral peripelvic cysts from [facility] also appreciated CT up to 3.7 cm on 02/2015.  No stones.    PVR 20 cc.   PMH: Past Medical History:  Diagnosis Date  . Acid reflux   . Anxiety   . Cancer ([place]) skin   Squamous cell skin cancer  . Depression   . Hyperlipidemia   . Hypertension     Surgical History: Past Surgical History:  Procedure Laterality Date  . BLADDER SURGERY     Prolapse bladder, bladder sling surgery  . CATARACT EXTRACTION    . CHOLECYSTECTOMY    . HIP ARTHROPLASTY      Home Medications:  Allergies as of 07/24/2017   No Known Allergies     Medication List        Accurate as  of 07/24/17 11:25 AM. Always use your most recent med list.          acetaminophen 500 MG tablet Commonly known as:  TYLENOL Take by mouth.   ALPRAZolam 0.25 MG tablet Commonly known as:  XANAX TAKE ONE TABLET BY MOUTH TWICE DAILY AS NEEDED   amLODipine 10 MG tablet Commonly known as:  NORVASC TAKE ONE (1) TABLET EACH DAY   conjugated estrogens vaginal cream Commonly known as:  PREMARIN Place vaginally.     DULoxetine 20 MG capsule Commonly known as:  CYMBALTA TAKE ONE (1) CAPSULE EACH DAY NERVES   furosemide 20 MG tablet Commonly known as:  LASIX TAKE ONE (1) TABLET EACH DAY FOR EDEMA   meloxicam 15 MG tablet Commonly known as:  MOBIC TAKE ONE TABLET BY MOUTH EVERY MORNING FOR ARTHRITIS   metroNIDAZOLE 0.75 % vaginal gel Commonly known as:  METROGEL 1 applicator once a week x  6 months   mirabegron ER 50 MG Tb24 tablet Commonly known as:  MYRBETRIQ Take by mouth.   pantoprazole 40 MG tablet Commonly known as:  PROTONIX TAKE ONE TABLET BY MOUTH TWICE DAILY   pramipexole 0.5 MG tablet Commonly known as:  MIRAPEX TAKE ONE TABLET BY MOUTH EVERY NIGHT AT BEDTIME, MAY TAKE EXTRA 1/2 TABLET IF NEEDED.   ranitidine 150 MG tablet Commonly known as:  ZANTAC Take by mouth.   telmisartan 80 MG tablet Commonly known as:  MICARDIS Take by mouth.   traMADol 50 MG tablet Commonly known as:  ULTRAM TAKE ONE TABLET BY MOUTH THREE TIMES A DAY AS NEEDED FOR PAIN   vitamin B-12 1000 MCG tablet Commonly known as:  CYANOCOBALAMIN Take by mouth.   vitamin C 1000 MG tablet Take by mouth.   Vitamin D 2000 units tablet Take by mouth.       Allergies: No Known Allergies  Family History: Family History  Problem Relation Age of Onset  . Kidney disease Brother   . AAA (abdominal aortic aneurysm) Neg Hx     Social History:  reports that  has never smoked. she has never used smokeless tobacco. She reports that she does not drink alcohol or use drugs.  ROS: UROLOGY Frequent Urination?: No Hard to postpone urination?: No Burning/pain with urination?: No Get up at night to urinate?: No Leakage of urine?: No Urine stream starts and stops?: No Trouble starting stream?: No Do you have to strain to urinate?: No Blood in urine?: No Urinary tract infection?: No Sexually transmitted disease?: No Injury to kidneys or bladder?: No Painful intercourse?: No Weak stream?: No Currently  pregnant?: No Vaginal bleeding?: No Last menstrual period?: n  Gastrointestinal Nausea?: No Vomiting?: No Indigestion/heartburn?: No Diarrhea?: No Constipation?: No  Constitutional Fever: No Night sweats?: No Weight loss?: No Fatigue?: No  Skin Skin rash/lesions?: No Itching?: No  Eyes Blurred vision?: No Double vision?: No  Ears/Nose/Throat Sore throat?: No Sinus problems?: Yes  Hematologic/Lymphatic Swollen glands?: No Easy bruising?: Yes  Cardiovascular Leg swelling?: No Chest pain?: No  Respiratory Cough?: No Shortness of breath?: No  Endocrine Excessive thirst?: No  Musculoskeletal Back pain?: Yes Joint pain?: Yes  Neurological Headaches?: No Dizziness?: No  Psychologic Depression?: No Anxiety?: Yes  Physical Exam: BP (!) 151/94   Pulse 98   Ht 5\' 4"  (1.626 m)   Wt 160 lb (72.6 kg)   BMI 27.46 kg/m   Constitutional: Alert and oriented.  No acute distress. HEENT: [place] AT, moist mucus membranes.  Trachea midline, no masses.  Cardiovascular: No clubbing, cyanosis, or edema. Respiratory: No respiratory distress or increased work of breathing. GI: Abdomen is soft, nontender, nonfocal. Pelvic: Normal external genitalia with minimal erythema or inflammatory changes.  She does have slightly atrophic vaginal tissue.  Small urethral carbuncle appreciated.  Minimal urethral hypermobility with Valsalva without demonstrable stress incontinence.  Small anterior vaginal fold with apical descent with Valsalva, otherwise fairly good vaginal support.  Pronounced vaginal rugae appreciated.  No vaginal odor or discharge appreciated. Skin: No rashes, bruises or suspicious lesions. Neurologic: Grossly intact, no focal deficits, moving all 4 extremities. Psychiatric: Normal mood and affect.  Laboratory Data: Lab Results  Component Value Date   WBC 6.4 06/22/2014   HGB 11.2 (L) 07/06/2014   HCT 46.9 06/22/2014   MCV 93 06/22/2014   PLT 117 (L) 07/06/2014     Lab Results  Component Value Date   CREATININE 0.76 07/06/2014   Urinalysis NA-no urinary complaints today  Pertinent Imaging: N/A  Assessment & Plan:    1.  Vaginal irritation/chronic atrophic vaginitis No obvious pathology today on vaginal exam I recommended reinitiation of metronidazole gel in addition to topical estrogen cream I strongly encouraged her to call Dr. Migdalia Dk office for further evaluation of her vaginal/vulvar complaints  2. Urinary urgency Doing well on Myrbetriq, continue this medication  3.  Recurrent UTI No symptomatic infections over the past 6 months Reviewed/educated about differentiation between chronic bacterial colonization and symptomatic UTI-would recommend treatment only for symptomatic infections Advised to call if she develops any urinary tract symptoms  Return in about 6 months (around 01/21/2018) for reschedule previous follow up.  Hollice Espy, MD    [facility] [address], [place], [place] [address] [phone]  I spent 25 min with this patient of which greater than 50% was spent in counseling and coordination of care with the patient.

## 2017-07-25 ENCOUNTER — Ambulatory Visit: Payer: Medicare Other | Admitting: Urology

## 2017-09-16 ENCOUNTER — Ambulatory Visit: Payer: Medicare Other | Admitting: Urology

## 2017-10-02 ENCOUNTER — Other Ambulatory Visit: Payer: Self-pay

## 2017-10-02 ENCOUNTER — Emergency Department: Payer: Medicare Other

## 2017-10-02 ENCOUNTER — Emergency Department
Admission: EM | Admit: 2017-10-02 | Discharge: 2017-10-02 | Disposition: A | Discharge status: Home / Self Care | Payer: Medicare Other | Attending: Emergency Medicine | Admitting: Emergency Medicine

## 2017-10-02 DIAGNOSIS — Z23 Encounter for immunization: Secondary | ICD-10-CM | POA: Insufficient documentation

## 2017-10-02 DIAGNOSIS — Y939 Activity, unspecified: Secondary | ICD-10-CM | POA: Diagnosis not present

## 2017-10-02 DIAGNOSIS — S52591A Other fractures of lower end of right radius, initial encounter for closed fracture: Secondary | ICD-10-CM | POA: Diagnosis not present

## 2017-10-02 DIAGNOSIS — Z85828 Personal history of other malignant neoplasm of skin: Secondary | ICD-10-CM | POA: Diagnosis not present

## 2017-10-02 DIAGNOSIS — I10 Essential (primary) hypertension: Secondary | ICD-10-CM | POA: Insufficient documentation

## 2017-10-02 DIAGNOSIS — Y999 Unspecified external cause status: Secondary | ICD-10-CM | POA: Diagnosis not present

## 2017-10-02 DIAGNOSIS — S8982XA Other specified injuries of left lower leg, initial encounter: Secondary | ICD-10-CM | POA: Diagnosis not present

## 2017-10-02 DIAGNOSIS — Y92007 Garden or yard of unspecified non-institutional (private) residence as the place of occurrence of the external cause: Secondary | ICD-10-CM | POA: Insufficient documentation

## 2017-10-02 DIAGNOSIS — S52501A Unspecified fracture of the lower end of right radius, initial encounter for closed fracture: Secondary | ICD-10-CM

## 2017-10-02 DIAGNOSIS — W010XXA Fall on same level from slipping, tripping and stumbling without subsequent striking against object, initial encounter: Secondary | ICD-10-CM | POA: Diagnosis not present

## 2017-10-02 DIAGNOSIS — Z79899 Other long term (current) drug therapy: Secondary | ICD-10-CM | POA: Insufficient documentation

## 2017-10-02 DIAGNOSIS — S59911A Unspecified injury of right forearm, initial encounter: Secondary | ICD-10-CM | POA: Diagnosis present

## 2017-10-02 DIAGNOSIS — S81812A Laceration without foreign body, left lower leg, initial encounter: Secondary | ICD-10-CM

## 2017-10-02 MED ORDER — ETOMIDATE 2 MG/ML IV SOLN
INTRAVENOUS | Status: AC | PRN
  Administered 2017-10-02 (×2): 5 mg via INTRAVENOUS

## 2017-10-02 MED ORDER — TRAMADOL HCL 50 MG PO TABS: 50.0000 mg | ORAL_TABLET | Freq: Four times a day (QID) | ORAL | 0 refills | Status: DC | PRN

## 2017-10-02 MED ORDER — IBUPROFEN 600 MG PO TABS: 600.0000 mg | ORAL_TABLET | Freq: Three times a day (TID) | ORAL | 0 refills | Status: DC | PRN

## 2017-10-02 MED ORDER — MORPHINE SULFATE (PF) 2 MG/ML IV SOLN
2.0000 mg | Freq: Once | INTRAVENOUS | Status: AC
  Administered 2017-10-02: 2 mg via INTRAVENOUS
  Filled 2017-10-02: qty 1

## 2017-10-02 MED ORDER — ONDANSETRON HCL 4 MG/2ML IJ SOLN
4.0000 mg | Freq: Once | INTRAMUSCULAR | Status: AC
Start: 2017-10-02 — End: 2017-10-02
  Administered 2017-10-02: 4 mg via INTRAVENOUS
  Filled 2017-10-02: qty 2

## 2017-10-02 MED ORDER — TETANUS-DIPHTH-ACELL PERTUSSIS 5-2.5-18.5 LF-MCG/0.5 IM SUSP
0.5000 mL | Freq: Once | INTRAMUSCULAR | Status: AC
  Administered 2017-10-02: 0.5 mL via INTRAMUSCULAR
  Filled 2017-10-02: qty 0.5

## 2017-10-02 MED ORDER — ETOMIDATE 2 MG/ML IV SOLN
10.0000 mg | Freq: Once | INTRAVENOUS | Status: DC
  Filled 2017-10-02: qty 10

## 2017-10-02 MED ORDER — HYDROCODONE-ACETAMINOPHEN 5-325 MG PO TABS: 1.0000 | ORAL_TABLET | Freq: Four times a day (QID) | ORAL | 0 refills | Status: DC | PRN

## 2017-10-02 MED ORDER — MORPHINE SULFATE (PF) 2 MG/ML IV SOLN
2.0000 mg | Freq: Once | INTRAVENOUS | Status: AC
  Administered 2017-10-02: 2 mg via INTRAVENOUS

## 2017-10-02 MED ORDER — MORPHINE SULFATE (PF) 2 MG/ML IV SOLN
INTRAVENOUS | Status: AC
  Filled 2017-10-02: qty 1

## 2017-10-02 NOTE — Sedation Documentation (Signed)
Patient identified as Traci [name] by Dr. Reita Cliche and Dorian Pod RN.

## 2017-10-02 NOTE — ED Notes (Signed)
Pt still has splint in place but reports severe pain in her right wrist.  She was helped to the bathroom and edp was notified of pt severe pain and pain meds was given.

## 2017-10-02 NOTE — Sedation Documentation (Signed)
Pt is speaking again.  States that she does not recall reduction, splint is in place.

## 2017-10-02 NOTE — ED Triage Notes (Signed)
Pt comes from home via [facility] with c/o fall. Pt states she tripped over fence while trying to take picture of her flowers. Pt denies LOC. Per EMS pt has obvious deformity to right wrist. Splint in place at this time. EMS gave 200 mL fluid and 4 of zofran fro nausea. Pt is alert and oriented X4. Respirations even and unlabored at this time.

## 2017-10-02 NOTE — Sedation Documentation (Signed)
Reduction by Dr. Reita Cliche.  Lanny Hurst EMT assisting and applying splint

## 2017-10-02 NOTE — Discharge Instructions (Signed)
Return to the emergency department immediately for any worsening condition including uncontrolled pain, tingling or numbness in the fingers are blue fingers, or any other symptoms concerning to you.  As we discussed, leave ER splint in place until seen by the orthopedic surgeon, next week for surgery scheduling.  May also take tylenol 500mg  every 4 hours for pain.

## 2017-10-02 NOTE — Sedation Documentation (Signed)
Vital signs stable. 

## 2017-10-02 NOTE — Sedation Documentation (Signed)
ED Provider at bedside. 

## 2017-10-02 NOTE — Sedation Documentation (Signed)
Medication dose calculated and verified for pt by Dr. Reita Cliche

## 2017-10-02 NOTE — ED Provider Notes (Addendum)
[facility] Emergency Department Provider Note ____________________________________________   I have reviewed the triage vital signs and the triage nursing note.  HISTORY  Chief Complaint Fall   Historian Patient  HPI Traci [name] is a 84 y.o. female presents from home after fall and sustained right wrist pain and deformity.  Patient was attempting to take pictures of her [place]aff[place]ils when she tripped and landed onto her outstretched arm and has pain and deformity to the right wrist.  Weakness or numbness.  She has a tiny skin tear to her left leg without pain.  Denies head injury, neck injury, chest or back injury.  She states her tailbone feels a little sore, but she was able to stand up and walk.  She is not reporting any other extremity injuries or other traumatic injuries.  Pain in the wrist is moderate.  Movement makes it worse.  She is currently wearing an EMS splint.  She is right-handed.       Past Medical History:  Diagnosis Date  . Acid reflux   . Anxiety   . Cancer ([place]) skin   Squamous cell skin cancer  . Depression   . Hyperlipidemia   . Hypertension     There are no active problems to display for this patient.   Past Surgical History:  Procedure Laterality Date  . BLADDER SURGERY     Prolapse bladder, bladder sling surgery  . CATARACT EXTRACTION    . CHOLECYSTECTOMY    . HIP ARTHROPLASTY      Prior to Admission medications   Medication Sig Start Date End Date Taking? Authorizing Provider  ALPRAZolam Duanne Moron) 0.25 MG tablet TAKE ONE TABLET BY MOUTH DAILY AS NEEDED 08/09/16  Yes [provider]  amLODipine (NORVASC) 10 MG tablet TAKE ONE (1) TABLET EACH DAY 02/12/16  Yes [provider]  Ascorbic Acid (VITAMIN C) 1000 MG tablet Take 1,000 mg by mouth daily.    Yes [provider]  Cholecalciferol (VITAMIN D) 2000 units tablet Take 2,000 Units by mouth daily.    Yes [provider]   conjugated estrogens (PREMARIN) vaginal cream Place 1 Applicatorful vaginally daily.    Yes [provider]  furosemide (LASIX) 20 MG tablet TAKE ONE (1) TABLET EACH DAY FOR EDEMA 09/23/16  Yes [provider]  meloxicam (MOBIC) 15 MG tablet TAKE ONE TABLET BY MOUTH EVERY MORNING FOR ARTHRITIS 10/01/16  Yes [provider]  mirabegron ER (MYRBETRIQ) 50 MG TB24 tablet Take 50 mg by mouth 3 (three) times a week.  09/23/16  Yes [provider]  pantoprazole (PROTONIX) 40 MG tablet TAKE ONE TABLET BY MOUTH DAILY 03/11/17  Yes [provider]  pramipexole (MIRAPEX) 0.5 MG tablet TAKE ONE TABLET BY MOUTH EVERY NIGHT AT BEDTIME, MAY TAKE EXTRA 1/2 TABLET IF NEEDED. 04/25/16  Yes [provider]  telmisartan (MICARDIS) 80 MG tablet Take 80 mg by mouth daily.  03/17/17  Yes [provider]  triamcinolone ointment (KENALOG) 0.5 % Apply 1 application topically daily. 07/25/17  Yes [provider]  vitamin B-12 (CYANOCOBALAMIN) 1000 MCG tablet Take 1,000 mcg by mouth daily.    Yes [provider]  acetaminophen (TYLENOL) 500 MG tablet Take 500 mg by mouth every 8 (eight) hours as needed.     [provider]  HYDROcodone-acetaminophen (NORCO/VICODIN) 5-325 MG tablet Take 1 tablet by mouth every 6 (six) hours as needed for moderate pain. 10/02/17   Lisa Roca, MD  ranitidine (ZANTAC) 150 MG  tablet Take 150 mg by mouth at bedtime.     [provider]  traMADol (ULTRAM) 50 MG tablet TAKE ONE TABLET BY MOUTH THREE TIMES A DAY AS NEEDED FOR PAIN 09/02/16   [provider]    No Known Allergies  Family History  Problem Relation Age of Onset  . Kidney disease Brother   . AAA (abdominal aortic aneurysm) Neg Hx     Social History Social History   Tobacco Use  . Smoking status: Never Smoker  . Smokeless tobacco: Never Used  Substance Use Topics  . Alcohol use: No  . Drug use: No    Review of  Systems  Constitutional: Negative for fever. Eyes: Negative for visual changes. ENT: Negative for sore throat.  Negative for facial injury Cardiovascular: Negative for chest pain. Respiratory: Negative for shortness of breath. Gastrointestinal: Negative for abdominal pain, vomiting and diarrhea. Genitourinary: Negative for dysuria. Musculoskeletal: Negative for back pain. Skin: Negative for rash. Neurological: Negative for headache.  ____________________________________________   PHYSICAL EXAM:  VITAL SIGNS: ED Triage Vitals  Enc Vitals Group     BP 10/02/17 1149 100/65     Pulse Rate 10/02/17 1149 74     Resp 10/02/17 1149 18     Temp 10/02/17 1149 97.8 F (36.6 C)     Temp Source 10/02/17 1149 Oral     SpO2 10/02/17 1149 99 %     Weight 10/02/17 1150 160 lb (72.6 kg)     Height 10/02/17 1150 5' 3.5" (1.613 m)     Head Circumference --      Peak Flow --      Pain Score 10/02/17 1149 10     Pain Loc --      Pain Edu? --      Excl. in [place]? --      Constitutional: Alert and oriented. Well appearing and in no distress. HEENT   Head: Normocephalic and atraumatic.      Eyes: Conjunctivae are normal. Pupils equal and round.       Ears:         Nose: No congestion/rhinnorhea.   Mouth/Throat: Mucous membranes are moist.   Neck: No stridor. Cardiovascular/Chest: Normal rate, regular rhythm.  No murmurs, rubs, or gallops. Respiratory: Normal respiratory effort without tachypnea nor retractions. Breath sounds are clear and equal bilaterally. No wheezes/rales/rhonchi. Gastrointestinal: Soft. No distention, no guarding, no rebound. Nontender.    Genitourinary/rectal:Deferred Musculoskeletal: Right distal radius swelling tenderness and deformity, neurovascular intact.  No other extremity findings.  Nontender with normal range of motion in all extremities. No joint effusions.  No lower extremity tenderness.  No edema. Neurologic:  Normal speech and language. No gross or  focal neurologic deficits are appreciated. Skin:  Skin is warm, dry and intact. No rash noted. Psychiatric: Mood and affect are normal. Speech and behavior are normal. Patient exhibits appropriate insight and judgment.   ____________________________________________  LABS (pertinent positives/negatives) I, Lisa Roca, MD the attending physician have reviewed the labs noted below.  Labs Reviewed - No data to display   ____________________________________________  RADIOLOGY   Right wrist complete: Distal radius fracture with overlap Radiologist interpretation:FINDINGS: Fracture of the distal radius with dorsal angulation and displacement. There is 1.3 cm of override. The radiocarpal joint is intact. No carpal fracture.  IMPRESSION: Distal radius fracture with dorsal displacement and override.  Right complete post reduction: Improvement in alignment Radiologist interpretation:FINDINGS: Again seen is an impacted, dorsally angulated distal radius fracture. Slightly improved dorsal and radial  displacement. Diffuse soft tissue swelling about the wrist.  IMPRESSION: 1. Impacted, dorsally angulated distal radius fracture with improved dorsal and radial displacement. __________________________________________  PROCEDURES  Procedure(s) performed: Splint: Indication right forearm fracture.  Placed by myself and ED tech.  Sugar tong to the right arm.  Neurovascular intact before after procedure.  Sling also placed.  .Sedation Date/Time: 10/02/2017 6:10 PM Performed by: Lisa Roca, MD Authorized by: Lisa Roca, MD   Consent:    Consent obtained:  Written (electronic informed consent)   Consent given by:  Patient   Risks discussed:  Allergic reaction, dysrhythmia, inadequate sedation, nausea, vomiting, respiratory compromise necessitating ventilatory assistance and intubation, prolonged sedation necessitating reversal and prolonged hypoxia resulting in organ  damage Universal protocol:    Procedure explained and questions answered to patient or proxy's satisfaction: yes     Relevant documents present and verified: yes     Test results available and properly labeled: yes     Imaging studies available: yes     Required blood products, implants, devices, and special equipment available: yes     Immediately prior to procedure a time out was called: yes     Patient identity confirmation method:  Arm band Indications:    Procedure performed:  Dislocation reduction   Procedure necessitating sedation performed by:  Physician performing sedation   Intended level of sedation:  Moderate (conscious sedation) Pre-sedation assessment:    Time since last food or drink:  Breakfast   ASA classification: class 2 - patient with mild systemic disease     Mallampati score:  I - soft palate, uvula, fauces, pillars visible   Pre-sedation assessments completed and reviewed: airway patency, cardiovascular function, mental status, nausea/vomiting, pain level and respiratory function   Immediate pre-procedure details:    Reassessment: Patient reassessed immediately prior to procedure     Reviewed: vital signs, relevant labs/tests and NPO status     Verified: bag valve mask available, emergency equipment available, intubation equipment available, IV patency confirmed, oxygen available, reversal medications available and suction available   Procedure details (see MAR for exact dosages):    Preoxygenation:  Room air   Intra-procedure monitoring:  Blood pressure monitoring, continuous pulse oximetry, cardiac monitor, frequent vital sign checks and frequent LOC assessments   Intra-procedure events: none     Total Provider sedation time (minutes):  15 Post-procedure details:    Post-sedation assessment completed:  10/02/2017 6:11 PM   Attendance: Constant attendance by certified staff until patient recovered     Recovery: Patient returned to pre-procedure baseline      Post-sedation assessments completed and reviewed: airway patency, cardiovascular function, hydration status, mental status and respiratory function     Patient is stable for discharge or admission: yes     Patient tolerance:  Tolerated well, no immediate complications    Critical Care performed: None   ____________________________________________  ED COURSE / ASSESSMENT AND PLAN  Pertinent labs & imaging results that were available during my care of the patient were reviewed by me and considered in my medical decision making (see chart for details).   Patient fell on outstretched arm with distal radius fracture with overlap requiring conscious sedation and dislocation reduction after discussion with orthopedic surgeon.  Patient tolerated procedure and sedation well.  Discussed return precautions.  Neurovascular intact before and after procedure.  CONSULTATIONS:   Dr. Harlow Mares, ortho -recommends dislocation reduction and placed in splint and follow-up in the office for likely surgical repair next week.   Patient /  Family / Caregiver informed of clinical course, medical decision-making process, and agree with plan.   I discussed return precautions, follow-up instructions, and discharge instructions with patient and/or family.  Discharge Instructions : Return to the emergency department immediately for any worsening condition including uncontrolled pain, tingling or numbness in the fingers are blue fingers, or any other symptoms concerning to you.  As we discussed, leave ER splint in place until seen by the orthopedic surgeon, next week for surgery scheduling.   ___________________________________________   FINAL CLINICAL IMPRESSION(S) / ED DIAGNOSES   Final diagnoses:  Skin tear of left lower leg without complication, initial encounter  Closed fracture of distal end of right radius, unspecified fracture morphology, initial encounter       ___________________________________________        Note: This dictation was prepared with Dragon dictation. Any transcriptional errors that result from this process are unintentional    Lisa Roca, MD 10/02/17 Dionicia Abler    Lisa Roca, MD 10/02/17 Megan Salon    Lisa Roca, MD 10/02/17 [phone]

## 2017-10-02 NOTE — Sedation Documentation (Signed)
Patient is resting comfortably. 

## 2017-10-03 ENCOUNTER — Other Ambulatory Visit: Payer: Self-pay | Admitting: Orthopedic Surgery

## 2017-10-06 ENCOUNTER — Ambulatory Visit: Payer: Self-pay | Admitting: Orthopedic Surgery

## 2017-10-06 ENCOUNTER — Other Ambulatory Visit: Payer: Self-pay

## 2017-10-06 ENCOUNTER — Encounter
Admission: RE | Admit: 2017-10-06 | Discharge: 2017-10-06 | Disposition: A | Discharge status: Home / Self Care | Payer: Medicare Other | Source: Ambulatory Visit | Attending: Orthopedic Surgery | Admitting: Orthopedic Surgery

## 2017-10-06 HISTORY — DX: Other skin changes: R23.8

## 2017-10-06 HISTORY — DX: Headache: R51

## 2017-10-06 HISTORY — DX: Unspecified osteoarthritis, unspecified site: M19.90

## 2017-10-06 HISTORY — DX: Headache, unspecified: R51.9

## 2017-10-06 HISTORY — DX: Low back pain: M54.5

## 2017-10-06 HISTORY — DX: Spontaneous ecchymoses: R23.3

## 2017-10-06 HISTORY — DX: Low back pain, unspecified: M54.50

## 2017-10-06 MED ORDER — CEFAZOLIN SODIUM-DEXTROSE 2-4 GM/100ML-% IV SOLN
2.0000 g | INTRAVENOUS | Status: AC
  Administered 2017-10-07: 2 g via INTRAVENOUS

## 2017-10-06 NOTE — Patient Instructions (Signed)
Your procedure is scheduled on: 10-07-17 Report to Same Day Surgery 2nd floor medical mall [facility] Entrance-take elevator on left to 2nd floor.  Check in with surgery information desk.) @ 8:30 AM PER PT   Remember: Instructions that are not followed completely may result in serious medical risk, up to and including death, or upon the discretion of your surgeon and anesthesiologist your surgery may need to be rescheduled.    _x___ 1. Do not eat food after midnight the night before your procedure. NO GUM OR CANDY AFTER MIDNIGHT.  You may drink clear liquids up to 2 hours before you are scheduled to arrive at the hospital for your procedure.  Do not drink clear liquids within 2 hours of your scheduled arrival to the hospital.  Clear liquids include  --Water or Apple juice without pulp  --Clear carbohydrate beverage such as ClearFast or Gatorade  --Black Coffee or Clear Tea (No milk, no creamers, do not add anything to  the coffee or Tea     __x__ 2. No Alcohol for 24 hours before or after surgery.   __x__3. No Smoking or e-cigarettes for 24 prior to surgery.  Do not use any chewable tobacco products for at least 6 hour prior to surgery   ____  4. Bring all medications with you on the day of surgery if instructed.    __x__ 5. Notify your doctor if there is any change in your medical condition     (cold, fever, infections).    x___6. On the morning of surgery brush your teeth with toothpaste and water.  You may rinse your mouth with mouth wash if you wish.  Do not swallow any toothpaste or mouthwash.   Do not wear jewelry, make-up, hairpins, clips or nail polish.  Do not wear lotions, powders, or perfumes. You may wear deodorant.  Do not shave 48 hours prior to surgery. Men may shave face and neck.  Do not bring valuables to the hospital.    [facility] is not responsible for any belongings or valuables.               Contacts, dentures or bridgework may not be worn into  surgery.  Leave your suitcase in the car. After surgery it may be brought to your room.  For patients admitted to the hospital, discharge time is determined by your treatment team.  _  Patients discharged the day of surgery will not be allowed to drive home.  You will need someone to drive you home and stay with you the night of your procedure.    Please read over the following fact sheets that you were given:   [facility] Preparing for Surgery and or MRSA Information   _x___ TAKE THE FOLLOWING MEDICATION THE MORNING OF SURGERY WITH A SMALL SIP OF WATER. These include:  1. ALPRAZOLAM  2. AMLODIPINE  3. PANTOPRAZOLE  4. TAKE AN EXTRA PANTOPRAZOLE TONIGHT BEFORE BED  5. YOU MAY TAKE TRAMADOL AM OF SURGERY IF NEEDED WITH A SMALL SIP OF WATER  6.  ____Fleets enema or Magnesium Citrate as directed.   ____ Use CHG Soap or sage wipes as directed on instruction sheet   ____ Use inhalers on the day of surgery and bring to hospital day of surgery  ____ Stop Metformin and Janumet 2 days prior to surgery.    ____ Take 1/2 of usual insulin dose the night before surgery and none on the morning surgery.   ____ Follow recommendations from Cardiologist,  Pulmonologist or PCP regarding stopping Aspirin, Coumadin, Plavix ,Eliquis, Effient, or Pradaxa, and Pletal.  X____Stop Anti-inflammatories such as Advil, Aleve, Ibuprofen, Motrin, Naproxen, Naprosyn, Goodies powders or aspirin products-OK to take Tylenol OR TRAMADOL-PT HAS ALREADY STOPPED HER MELOXICAM AND IBUPROFEN   ____ Stop supplements until after surgery.     ____ Bring C-Pap to the hospital.

## 2017-10-06 NOTE — Pre-Procedure Instructions (Signed)
ECG 12-lead9/20/2017 [place] Component Name Value Ref Range  Vent Rate (bpm) 90   PR Interval (msec) 150   QRS Interval (msec) 82   QT Interval (msec) 346   QTc (msec) 423   Result Narrative  Normal sinus rhythm Left axis deviation Abnormal ECG No previous ECGs available I reviewed and concur with this report. Electronically signed YX:[email or web address] MD, [place] [phone]) on 04/16/2016 5:30:33 PM  Status Results Details   Encounter Summary

## 2017-10-07 ENCOUNTER — Ambulatory Visit
Admission: RE | Admit: 2017-10-07 | Discharge: 2017-10-07 | Disposition: A | Discharge status: Home / Self Care | Payer: Medicare Other | Source: Ambulatory Visit | Attending: Orthopedic Surgery | Admitting: Orthopedic Surgery

## 2017-10-07 ENCOUNTER — Ambulatory Visit: Payer: Medicare Other | Admitting: Anesthesiology

## 2017-10-07 ENCOUNTER — Other Ambulatory Visit: Payer: Self-pay

## 2017-10-07 ENCOUNTER — Encounter
Admission: RE | Disposition: A | Discharge status: Home / Self Care | Payer: Self-pay | Source: Ambulatory Visit | Attending: Orthopedic Surgery

## 2017-10-07 DIAGNOSIS — Z79899 Other long term (current) drug therapy: Secondary | ICD-10-CM | POA: Insufficient documentation

## 2017-10-07 DIAGNOSIS — W010XXA Fall on same level from slipping, tripping and stumbling without subsequent striking against object, initial encounter: Secondary | ICD-10-CM | POA: Diagnosis not present

## 2017-10-07 DIAGNOSIS — S52571A Other intraarticular fracture of lower end of right radius, initial encounter for closed fracture: Secondary | ICD-10-CM | POA: Diagnosis present

## 2017-10-07 DIAGNOSIS — E785 Hyperlipidemia, unspecified: Secondary | ICD-10-CM | POA: Diagnosis not present

## 2017-10-07 DIAGNOSIS — I10 Essential (primary) hypertension: Secondary | ICD-10-CM | POA: Insufficient documentation

## 2017-10-07 DIAGNOSIS — F419 Anxiety disorder, unspecified: Secondary | ICD-10-CM | POA: Diagnosis not present

## 2017-10-07 DIAGNOSIS — F329 Major depressive disorder, single episode, unspecified: Secondary | ICD-10-CM | POA: Insufficient documentation

## 2017-10-07 DIAGNOSIS — K219 Gastro-esophageal reflux disease without esophagitis: Secondary | ICD-10-CM | POA: Insufficient documentation

## 2017-10-07 DIAGNOSIS — Y92096 Garden or yard of other non-institutional residence as the place of occurrence of the external cause: Secondary | ICD-10-CM | POA: Diagnosis not present

## 2017-10-07 HISTORY — PX: OPEN REDUCTION INTERNAL FIXATION (ORIF) DISTAL RADIAL FRACTURE: SHX5989

## 2017-10-07 LAB — CBC
HCT: 41.2 % (ref 35.0–47.0)
Hemoglobin: 13.9 g/dL (ref 12.0–16.0)
MCH: 30.6 pg (ref 26.0–34.0)
MCHC: 33.6 g/dL (ref 32.0–36.0)
MCV: 90.8 fL (ref 80.0–100.0)
PLATELETS: 185 10*3/uL (ref 150–440)
RBC: 4.54 MIL/uL (ref 3.80–5.20)
RDW: 13.7 % (ref 11.5–14.5)
WBC: 7.9 10*3/uL (ref 3.6–11.0)

## 2017-10-07 LAB — BASIC METABOLIC PANEL
Anion gap: 8 (ref 5–15)
BUN: 23 mg/dL — ABNORMAL HIGH (ref 6–20)
CALCIUM: 9 mg/dL (ref 8.9–10.3)
CO2: 26 mmol/L (ref 22–32)
CREATININE: 1.03 mg/dL — AB (ref 0.44–1.00)
Chloride: 106 mmol/L (ref 101–111)
GFR calc non Af Amer: 49 mL/min — ABNORMAL LOW (ref >60)
GFR, EST AFRICAN AMERICAN: 57 mL/min — AB (ref >60)
Glucose, Bld: 91 mg/dL (ref 65–99)
Potassium: 4 mmol/L (ref 3.5–5.1)
SODIUM: 140 mmol/L (ref 135–145)

## 2017-10-07 SURGERY — OPEN REDUCTION INTERNAL FIXATION (ORIF) DISTAL RADIUS FRACTURE
Anesthesia: General | Laterality: Right | Wound class: Clean

## 2017-10-07 MED ORDER — GABAPENTIN 300 MG PO CAPS
300.0000 mg | ORAL_CAPSULE | Freq: Once | ORAL | Status: AC
  Administered 2017-10-07: 300 mg via ORAL

## 2017-10-07 MED ORDER — SODIUM CHLORIDE 0.9 % IV SOLN
INTRAVENOUS | Status: DC | PRN
  Administered 2017-10-07: 20 ug/min via INTRAVENOUS

## 2017-10-07 MED ORDER — FENTANYL CITRATE (PF) 100 MCG/2ML IJ SOLN: 25.0000 ug | INTRAMUSCULAR | Status: DC | PRN

## 2017-10-07 MED ORDER — LIDOCAINE HCL (PF) 1 % IJ SOLN
INTRAMUSCULAR | Status: DC | PRN
  Administered 2017-10-07: 1 mL via SUBCUTANEOUS

## 2017-10-07 MED ORDER — FENTANYL CITRATE (PF) 100 MCG/2ML IJ SOLN
INTRAMUSCULAR | Status: AC
  Administered 2017-10-07: 50 ug
  Filled 2017-10-07: qty 2

## 2017-10-07 MED ORDER — OXYCODONE HCL 5 MG/5ML PO SOLN: 5.0000 mg | Freq: Once | ORAL | Status: DC | PRN

## 2017-10-07 MED ORDER — ROPIVACAINE HCL 5 MG/ML IJ SOLN
INTRAMUSCULAR | Status: DC | PRN
  Administered 2017-10-07: 20 mL via PERINEURAL
  Administered 2017-10-07: 10 mL via PERINEURAL

## 2017-10-07 MED ORDER — LIDOCAINE HCL (PF) 1 % IJ SOLN
INTRAMUSCULAR | Status: AC
  Filled 2017-10-07: qty 5

## 2017-10-07 MED ORDER — LACTATED RINGERS IV SOLN
INTRAVENOUS | Status: DC
  Administered 2017-10-07: 09:00:00 via INTRAVENOUS

## 2017-10-07 MED ORDER — PROPOFOL 500 MG/50ML IV EMUL
INTRAVENOUS | Status: DC | PRN
  Administered 2017-10-07: 60 ug/kg/min via INTRAVENOUS

## 2017-10-07 MED ORDER — BUPIVACAINE-EPINEPHRINE (PF) 0.25% -1:200000 IJ SOLN
INTRAMUSCULAR | Status: AC
  Filled 2017-10-07: qty 10

## 2017-10-07 MED ORDER — OXYCODONE HCL 5 MG PO TABS: 5.0000 mg | ORAL_TABLET | Freq: Once | ORAL | Status: DC | PRN

## 2017-10-07 MED ORDER — PROPOFOL 500 MG/50ML IV EMUL
INTRAVENOUS | Status: AC
  Filled 2017-10-07: qty 50

## 2017-10-07 MED ORDER — MIDAZOLAM HCL 2 MG/2ML IJ SOLN: 1.0000 mg | Freq: Once | INTRAMUSCULAR | Status: DC

## 2017-10-07 MED ORDER — SODIUM CHLORIDE FLUSH 0.9 % IV SOLN
INTRAVENOUS | Status: AC
  Filled 2017-10-07: qty 10

## 2017-10-07 MED ORDER — LACTATED RINGERS IV SOLN: INTRAVENOUS | Status: DC

## 2017-10-07 MED ORDER — GABAPENTIN 300 MG PO CAPS
ORAL_CAPSULE | ORAL | Status: AC
  Administered 2017-10-07: 300 mg via ORAL
  Filled 2017-10-07: qty 1

## 2017-10-07 MED ORDER — PHENYLEPHRINE HCL 10 MG/ML IJ SOLN
INTRAMUSCULAR | Status: DC | PRN
  Administered 2017-10-07 (×7): 100 ug via INTRAVENOUS

## 2017-10-07 MED ORDER — LIDOCAINE HCL (PF) 2 % IJ SOLN
INTRAMUSCULAR | Status: AC
  Filled 2017-10-07: qty 10

## 2017-10-07 MED ORDER — LIDOCAINE HCL (CARDIAC) 20 MG/ML IV SOLN
INTRAVENOUS | Status: DC | PRN
  Administered 2017-10-07: 50 mg via INTRAVENOUS

## 2017-10-07 MED ORDER — ROPIVACAINE HCL 5 MG/ML IJ SOLN
INTRAMUSCULAR | Status: AC
  Filled 2017-10-07: qty 30

## 2017-10-07 MED ORDER — TRAMADOL HCL 50 MG PO TABS: 50.0000 mg | ORAL_TABLET | Freq: Four times a day (QID) | ORAL | 0 refills | Status: AC | PRN

## 2017-10-07 MED ORDER — BACITRACIN 50000 UNITS IM SOLR
INTRAMUSCULAR | Status: AC
  Filled 2017-10-07: qty 1

## 2017-10-07 MED ORDER — CEFAZOLIN SODIUM-DEXTROSE 2-4 GM/100ML-% IV SOLN
INTRAVENOUS | Status: AC
  Filled 2017-10-07: qty 100

## 2017-10-07 MED ORDER — DOCUSATE SODIUM 100 MG PO CAPS: 100.0000 mg | ORAL_CAPSULE | Freq: Every day | ORAL | 2 refills | Status: AC | PRN

## 2017-10-07 MED ORDER — PROPOFOL 10 MG/ML IV BOLUS
INTRAVENOUS | Status: DC | PRN
  Administered 2017-10-07: 10 mg via INTRAVENOUS
  Administered 2017-10-07: 40 mg via INTRAVENOUS

## 2017-10-07 MED ORDER — CHLORHEXIDINE GLUCONATE 4 % EX LIQD
60.0000 mL | Freq: Once | CUTANEOUS | Status: AC
  Administered 2017-10-07: 4 via TOPICAL

## 2017-10-07 MED ORDER — ACETAMINOPHEN 500 MG PO TABS
ORAL_TABLET | ORAL | Status: AC
  Administered 2017-10-07: 1000 mg via ORAL
  Filled 2017-10-07: qty 2

## 2017-10-07 MED ORDER — MIDAZOLAM HCL 2 MG/2ML IJ SOLN
INTRAMUSCULAR | Status: AC
  Filled 2017-10-07: qty 2

## 2017-10-07 MED ORDER — ACETAMINOPHEN 500 MG PO TABS
1000.0000 mg | ORAL_TABLET | Freq: Once | ORAL | Status: AC
  Administered 2017-10-07: 1000 mg via ORAL

## 2017-10-07 SURGICAL SUPPLY — 50 items
BANDAGE ELASTIC 2 CLIP ST LF (GAUZE/BANDAGES/DRESSINGS) ×3 IMPLANT
BANDAGE ELASTIC 3 CLIP ST LF (GAUZE/BANDAGES/DRESSINGS) ×3 IMPLANT
BIT DRILL 2.2 SS TIBIAL (BIT) ×3 IMPLANT
BNDG ESMARK 4X12 TAN STRL LF (GAUZE/BANDAGES/DRESSINGS) ×3 IMPLANT
BRUSH SCRUB EZ  4% CHG (MISCELLANEOUS) ×4
BRUSH SCRUB EZ 4% CHG (MISCELLANEOUS) ×2 IMPLANT
CANISTER SUCT 1200ML W/VALVE (MISCELLANEOUS) ×3 IMPLANT
CAST PADDING 3X4FT ST 30246 (SOFTGOODS) ×2
CHLORAPREP W/TINT 26ML (MISCELLANEOUS) ×6 IMPLANT
CORD BIP STRL DISP 12FT (MISCELLANEOUS) ×3 IMPLANT
CUFF TOURN 18 STER (MISCELLANEOUS) ×3 IMPLANT
CUFF TOURN 24 STER (MISCELLANEOUS) ×3 IMPLANT
DRAPE FLUOR MINI C-ARM 54X84 (DRAPES) ×3 IMPLANT
DRAPE SHEET LG 3/4 BI-LAMINATE (DRAPES) ×3 IMPLANT
DRAPE SURG 17X11 SM STRL (DRAPES) ×3 IMPLANT
ELECT REM PT RETURN 9FT ADLT (ELECTROSURGICAL) ×3
ELECTRODE REM PT RTRN 9FT ADLT (ELECTROSURGICAL) ×1 IMPLANT
FORCEPS JEWEL BIP 4-3/4 STR (INSTRUMENTS) ×3 IMPLANT
GAUZE PETRO XEROFOAM 1X8 (MISCELLANEOUS) ×3 IMPLANT
GAUZE SPONGE 4X4 12PLY STRL (GAUZE/BANDAGES/DRESSINGS) ×3 IMPLANT
GLOVE INDICATOR 8.0 STRL GRN (GLOVE) ×3 IMPLANT
GLOVE SURG ORTHO 8.0 STRL STRW (GLOVE) ×6 IMPLANT
GOWN STRL REUS W/ TWL LRG LVL3 (GOWN DISPOSABLE) ×1 IMPLANT
GOWN STRL REUS W/ TWL XL LVL3 (GOWN DISPOSABLE) ×1 IMPLANT
GOWN STRL REUS W/TWL LRG LVL3 (GOWN DISPOSABLE) ×2
GOWN STRL REUS W/TWL XL LVL3 (GOWN DISPOSABLE) ×2
K-WIRE 1.6 (WIRE) ×2
K-WIRE FX5X1.6XNS BN SS (WIRE) ×1
KIT TURNOVER KIT A (KITS) ×3 IMPLANT
KWIRE FX5X1.6XNS BN SS (WIRE) ×1 IMPLANT
NS IRRIG 1000ML POUR BTL (IV SOLUTION) ×3 IMPLANT
PACK EXTREMITY ARMC (MISCELLANEOUS) ×3 IMPLANT
PAD ABD DERMACEA PRESS 5X9 (GAUZE/BANDAGES/DRESSINGS) ×3 IMPLANT
PAD CAST CTTN 3X4 STRL (SOFTGOODS) ×1 IMPLANT
PIN GUIDE .062 (PIN) ×3 IMPLANT
PLATE DVR CROSSLOCK STD RT (Plate) ×3 IMPLANT
SCREW LOCK 14X2.7X 3 LD TPR (Screw) ×2 IMPLANT
SCREW LOCK 16X2.7X 3 LD TPR (Screw) ×4 IMPLANT
SCREW LOCK 18X2.7X 3 LD TPR (Screw) ×2 IMPLANT
SCREW LOCKING 2.7X14 (Screw) ×4 IMPLANT
SCREW LOCKING 2.7X16 (Screw) ×8 IMPLANT
SCREW LOCKING 2.7X18 (Screw) ×4 IMPLANT
SCREW NONLOCK 2.7X18MM (Screw) ×6 IMPLANT
SCREW NONLOCK 2.7X20MM (Screw) ×3 IMPLANT
SPLINT CAST 1 STEP 3X12 (MISCELLANEOUS) ×3 IMPLANT
STAPLER SKIN PROX 35W (STAPLE) ×3 IMPLANT
SUT ETHILON 3 0 PS 1 (SUTURE) ×3 IMPLANT
SUT VIC AB 3-0 SH 27 (SUTURE) ×4
SUT VIC AB 3-0 SH 27X BRD (SUTURE) ×2 IMPLANT
TOWEL OR 17X26 4PK STRL BLUE (TOWEL DISPOSABLE) ×3 IMPLANT

## 2017-10-07 NOTE — Anesthesia Procedure Notes (Addendum)
Anesthesia Regional Block: Supraclavicular block   Pre-Anesthetic Checklist: ,, timeout performed, Correct Patient, Correct Site, Correct Laterality, Correct Procedure, Correct Position, site marked, Risks and benefits discussed,  Surgical consent,  Pre-op evaluation,  At surgeon's request and post-op pain management  Laterality: Upper and Right  Prep: chloraprep       Needles:  Injection technique: Single-shot  Needle Type: Stimiplex     Needle Length: 5cm  Needle Gauge: 22     Additional Needles:   Narrative:  Start time: 10/07/2017 10:01 AM End time: 10/07/2017 10:08 AM Injection made incrementally with aspirations every 5 mL.  Performed by: Personally  Anesthesiologist: Lunell Robart, Precious Haws, MD  Additional Notes: Functioning IV was confirmed and monitors were applied.  A 74mm 22ga Stimuplex needle was used. Sterile prep,hand hygiene and sterile gloves were used.  Minimal sedation used for procedure.  No paresthesia endorsed by patient during the procedure.  Negative aspiration and negative test dose prior to incremental administration of local anesthetic. The patient tolerated the procedure well with no immediate complications.

## 2017-10-07 NOTE — H&P (Signed)
The patient has been re-examined, and the chart reviewed, and there have been no interval changes to the documented history and physical.  Plan a right wrist open reduction and internal fixation today.  Anesthesia is consulted regarding a peripheral nerve block for post-operative pain.  The risks, benefits, and alternatives have been discussed at length, and the patient is willing to proceed.

## 2017-10-07 NOTE — Anesthesia Post-op Follow-up Note (Signed)
Anesthesia QCDR form completed.        

## 2017-10-07 NOTE — Anesthesia Postprocedure Evaluation (Signed)
Anesthesia Post Note  Patient: Shaneeka A Marte  Procedure(s) Performed: OPEN REDUCTION INTERNAL FIXATION (ORIF) DISTAL RADIAL FRACTURE (Right )  Patient location during evaluation: PACU Anesthesia Type: General Level of consciousness: awake and alert Pain management: pain level controlled Vital Signs Assessment: post-procedure vital signs reviewed and stable Respiratory status: spontaneous breathing, nonlabored ventilation, respiratory function stable and patient connected to nasal cannula oxygen Cardiovascular status: blood pressure returned to baseline and stable Postop Assessment: no apparent nausea or vomiting Anesthetic complications: no     Last Vitals:  Vitals:   10/07/17 1337 10/07/17 1352  BP: 123/72 129/77  Pulse: 83 75  Resp: 16 18  Temp:    SpO2: 98% 95%    Last Pain:  Vitals:   10/07/17 1322  TempSrc:   PainSc: Asleep                 Precious Haws Adian Jablonowski

## 2017-10-07 NOTE — Discharge Instructions (Signed)
AMBULATORY SURGERY  DISCHARGE INSTRUCTIONS   1) The drugs that you were given will stay in your system until tomorrow so for the next 24 hours you should not:  A) Drive an automobile B) Make any legal decisions C) Drink any alcoholic beverage   2) You may resume regular meals tomorrow.  Today it is better to start with liquids and gradually work up to solid foods.  You may eat anything you prefer, but it is better to start with liquids, then soup and crackers, and gradually work up to solid foods.   3) Please notify your doctor immediately if you have any unusual bleeding, trouble breathing, redness and pain at the surgery site, drainage, fever, or pain not relieved by medication. 4)   5) Your post-operative visit with Dr.                                     is: Date:                        Time:    Please call to schedule your post-operative visit.  6) Additional Instructions:       Patient should elevate the right forearm continuously for the next 72 hours. Patient's wrist should remain above her heart.   She may flex and extend her fingers as tolerated. Patient may apply a bag of ice to the right wrist.   Keep the dressing clean dry and intact until her follow-up in the office.   Patient should follow up in 10 to 14 days.   She should avoid any weightbearing or lifting with the right arm until follow-up.   She should cover her dressing with a plastic bag to shower. Patient should contact the doctor immediately if she has loss of sensation, loss of motion, loss of circulation or extreme pain in the right wrist.    Dr. Harlow Mares' office phone number is [phone].

## 2017-10-07 NOTE — Anesthesia Procedure Notes (Signed)
Performed by: Janay Canan, CRNA Pre-anesthesia Checklist: Patient identified, Emergency Drugs available, Suction available, Patient being monitored and Timeout performed Patient Re-evaluated:Patient Re-evaluated prior to induction Oxygen Delivery Method: Simple face mask       

## 2017-10-07 NOTE — Anesthesia Preprocedure Evaluation (Signed)
Anesthesia Evaluation  Patient identified by MRN, date of birth, ID band Patient awake    Reviewed: Allergy & Precautions, H&P , NPO status , Patient's Chart, lab work & pertinent test results  History of Anesthesia Complications Negative for: history of anesthetic complications  Airway Mallampati: III  TM Distance: >3 FB Neck ROM: full    Dental  (+) Chipped   Pulmonary neg pulmonary ROS, neg shortness of breath,           Cardiovascular Exercise Tolerance: Good hypertension, (-) angina(-) Past MI and (-) DOE      Neuro/Psych  Headaches, PSYCHIATRIC DISORDERS Anxiety Depression    GI/Hepatic Neg liver ROS, GERD  Medicated and Controlled,  Endo/Other  negative endocrine ROS  Renal/GU negative Renal ROS  negative genitourinary   Musculoskeletal  (+) Arthritis ,   Abdominal   Peds  Hematology negative hematology ROS (+)   Anesthesia Other Findings Past Medical History: No date: Acid reflux No date: Anxiety No date: Arthritis No date: Bruises easily skin: Cancer ([facility])     Comment:  Squamous cell skin cancer No date: Depression No date: Headache     Comment:  H/O YEARS AGO No date: Hyperlipidemia No date: Hypertension No date: Lumbar pain  Past Surgical History: No date: BLADDER SURGERY     Comment:  Prolapse bladder, bladder sling surgery No date: CATARACT EXTRACTION No date: CHOLECYSTECTOMY No date: HIP ARTHROPLASTY No date: JOINT REPLACEMENT; Bilateral     Comment:  HIP REPLACMENT/ KNEE REPLACEMENT  BMI    Body Mass Index:  27.90 kg/m      Reproductive/Obstetrics negative OB ROS                             Anesthesia Physical Anesthesia Plan  ASA: III  Anesthesia Plan: General   Post-op Pain Management: GA combined w/ Regional for post-op pain   Induction: Intravenous  PONV Risk Score and Plan: Propofol infusion and TIVA  Airway Management Planned: Natural  Airway and Nasal Cannula  Additional Equipment:   Intra-op Plan:   Post-operative Plan:   Informed Consent: I have reviewed the patients History and Physical, chart, labs and discussed the procedure including the risks, benefits and alternatives for the proposed anesthesia with the patient or authorized representative who has indicated his/her understanding and acceptance.   Dental Advisory Given  Plan Discussed with: Anesthesiologist, CRNA and Surgeon  Anesthesia Plan Comments: (Patient consented for risks of anesthesia including but not limited to:  - adverse reactions to medications - risk of intubation if required - damage to teeth, lips or other oral mucosa - sore throat or hoarseness - Damage to heart, brain, lungs or loss of life  Patient voiced understanding.)        Anesthesia Quick Evaluation

## 2017-10-07 NOTE — Transfer of Care (Signed)
Immediate Anesthesia Transfer of Care Note  Patient: Traci [name]  Procedure(s) Performed: OPEN REDUCTION INTERNAL FIXATION (ORIF) DISTAL RADIAL FRACTURE (Right )  Patient Location: PACU  Anesthesia Type:General  Level of Consciousness: awake and responds to stimulation  Airway & Oxygen Therapy: Patient Spontanous Breathing and Patient connected to face mask oxygen  Post-op Assessment: Report given to RN and Post -op Vital signs reviewed and stable  Post vital signs: Reviewed and stable  Last Vitals:  Vitals:   10/07/17 1140 10/07/17 1322  BP: (!) 142/78 107/65  Pulse: 88 86  Resp: 19 16  Temp:    SpO2: 99% 99%    Last Pain:  Vitals:   10/07/17 1000  TempSrc:   PainSc: 8          Complications: No apparent anesthesia complications

## 2017-10-07 NOTE — Op Note (Signed)
10/07/2017  1:35 PM  PATIENT:  Traci [name]    PRE-OPERATIVE DIAGNOSIS:  S52.531A Colles fracture of right radius, init for clos fx  POST-OPERATIVE DIAGNOSIS:  Same  PROCEDURE:  OPEN REDUCTION INTERNAL FIXATION (ORIF) DISTAL RADIAL FRACTURE, RIGHT  SURGEON:  Lovell Sheehan, MD  ASSIST: none  TOURNIQUET TIME:  34  MIN  ANESTHESIA:   General  PREOPERATIVE INDICATIONS:  Traci [name] is a  84 y.o. female with a diagnosis of S52.531A Colles fracture of right radius, init for clos fx who failed conservative measures and elected for surgical management.    The risks benefits and alternatives were discussed with the patient preoperatively including but not limited to the risks of infection, bleeding, nerve injury, malunion, nonunion, wrist stiffness, persistent wrist pain, osteoarthritis and the need for further surgery. Medical risks include but are not limited to DVT and pulmonary embolism, myocardial infarction, stroke, pneumonia, respiratory failure and death. Patient  understood these risks and wished to proceed.   OPERATIVE IMPLANTS:  hand innovations plate  OPERATIVE FINDINGS: comminuted, displaced intra-articular distal radius fracture, right  OPERATIVE PROCEDURE: Patient was seen in the preoperative area. I marked the operative wrist according tl the hospital's correct site of surgery protocol. Patient was then brought to the operating roomand was placed supine on the operative table and underwent general anesthesia with an LMA.   The operative arm was prepped and draped in a sterile fashion. A timeout performed to verify the patient's name, date of birth, medical record number, correct site of surgery correct procedure to be performed. The timeout was also used a timeout to verify patient received antibiotics and appropriate instruments, implants and radiographs studies were available in the room. Once all in attendance were in agreement case began.   Patient then had the  operative extremity exsanguinated with an Esmarch. The tourniquet was placed on the upper extremity and inflated 250 mm Hg.  A manual reduction of the fracture was performed. The fracture reduction was confirmed on FluoroScan imaging.  A linear incision was then made over the FCR tendon. The subcutaneous tissue was carefully dissected using Metzenbaum scissor and Adson pickup. Retractors were used to protect the radial artery and median nerve. The pronator quadratus was identified and incised and elevated off the volar surface of the distal radius. A [employer] volar plate was then positioned on the under surface of the distal radius. It was held into position with a K wire. The position of the plate was confirmed on AP and lateral images. Once the plate was in good position a cortical screw was placed bicortically in the sliding hole. Attention was then turned to the distal pegs. The proximal row of pegs was placed first. Each individual peg hole was drilled and then measured with a depth gauge. The distal row was then drilled and screws were placed. The position and length of all screws were confirmed on AP and lateral FluoroScan imaging. Care was taken to avoid penetration of any screw through the articular surface of the distal radius.  Once all distal pegs were placed, the attention was turned back to placement of bicortical shaft screws. Additional screws were placed in the plate to fill the remaining holes. The wound was then copiously irrigated. Final FluoroScan imaging of the construct were taken. The fracture was in anatomic position and the hardware was well-positioned. The wound again was copiously irrigated. The soft tissue was then carefully over the plate. The skin was closed with staples. Xeroform and  a dry sterile dressing were applied along with a volar splint. I was scrubbed and present for the entire case and all sharp and instrument counts were correct at the conclusion the case. The  patient tolerated this procedure well and was awakened and taken to the recovery room in good condition.   Elyn Aquas. Harlow Mares, MD

## 2017-10-20 NOTE — H&P (Signed)
PREOPERATIVE H&P  Chief Complaint: S52.531A Colles fracture of right radius, init for clos fx  HPI: Traci [name] is a 84 y.o. female who presents for preoperative history and physical with a diagnosis of S52.531A Colles fracture of right radius, init for clos fx. Symptoms are rated as moderate to severe, and have been worsening.  This is significantly impairing activities of daily living.  She has elected for surgical management.   Past Medical History:  Diagnosis Date  . Acid reflux   . Anxiety   . Arthritis   . Bruises easily   . Cancer ([place]) skin   Squamous cell skin cancer  . Depression   . Headache    H/O YEARS AGO  . Hyperlipidemia   . Hypertension   . Lumbar pain    Past Surgical History:  Procedure Laterality Date  . BLADDER SURGERY     Prolapse bladder, bladder sling surgery  . CATARACT EXTRACTION    . CHOLECYSTECTOMY    . HIP ARTHROPLASTY    . JOINT REPLACEMENT Bilateral    HIP REPLACMENT/ KNEE REPLACEMENT  . OPEN REDUCTION INTERNAL FIXATION (ORIF) DISTAL RADIAL FRACTURE Right 10/07/2017   Procedure: OPEN REDUCTION INTERNAL FIXATION (ORIF) DISTAL RADIAL FRACTURE;  Surgeon: Lovell Sheehan, MD;  Location: [facility] ORS;  Service: Orthopedics;  Laterality: Right;   Social History   Socioeconomic History  . Marital status: Married    Spouse name: Not on file  . Number of children: Not on file  . Years of education: Not on file  . Highest education level: Not on file  Occupational History  . Not on file  Social Needs  . Financial resource strain: Not on file  . Food insecurity:    Worry: Not on file    Inability: Not on file  . Transportation needs:    Medical: Not on file    Non-medical: Not on file  Tobacco Use  . Smoking status: Never Smoker  . Smokeless tobacco: Never Used  Substance and Sexual Activity  . Alcohol use: No  . Drug use: No  . Sexual activity: Not on file  Lifestyle  . Physical activity:    Days per week: Not on file    Minutes per  session: Not on file  . Stress: Not on file  Relationships  . Social connections:    Talks on phone: Not on file    Gets together: Not on file    Attends religious service: Not on file    Active member of club or organization: Not on file    Attends meetings of clubs or organizations: Not on file    Relationship status: Not on file  Other Topics Concern  . Not on file  Social History Narrative  . Not on file   Family History  Problem Relation Age of Onset  . Kidney disease Brother   . AAA (abdominal aortic aneurysm) Neg Hx    Allergies  Allergen Reactions  . Adhesive [Tape]     PT HAS VERY SENSITIVE SKIN AND WILL BECOME IRRITATED   Prior to Admission medications   Medication Sig Start Date End Date Taking? Authorizing Provider  acetaminophen (TYLENOL) 500 MG tablet Take 500 mg by mouth every 4 (four) hours.    Yes [provider]  ALPRAZolam Duanne Moron) 0.25 MG tablet TAKE ONE TABLET BY MOUTH DAILY-AM 08/09/16  Yes [provider]  amLODipine (NORVASC) 10 MG tablet TAKE 0.5 TABLET EACH DAY-AM 02/12/16  Yes [provider]  Ascorbic  Acid (VITAMIN C) 1000 MG tablet Take 1,000 mg by mouth daily.    Yes [provider]  Cholecalciferol (VITAMIN D) 2000 units tablet Take 2,000 Units by mouth daily.    Yes [provider]  conjugated estrogens (PREMARIN) vaginal cream Place 1 Applicatorful vaginally 3 (three) times a week.    Yes [provider]  furosemide (LASIX) 20 MG tablet TAKE ONE (1) TABLET EACH DAY FOR EDEMA 09/23/16  Yes [provider]  ibuprofen (ADVIL,MOTRIN) 600 MG tablet Take 1 tablet (600 mg total) by mouth every 8 (eight) hours as needed. 10/02/17  Yes Lisa Roca, MD  mirabegron ER (MYRBETRIQ) 50 MG TB24 tablet Take 50 mg by mouth 3 (three) times a week.  09/23/16  Yes [provider]  pantoprazole (PROTONIX) 40 MG tablet TAKE ONE TABLET BY MOUTH DAILY-AM 03/11/17  Yes [provider]  pramipexole  (MIRAPEX) 0.5 MG tablet TAKE ONE TABLET BY MOUTH EVERY NIGHT AT BEDTIME, MAY TAKE EXTRA 1/2 TABLET IF NEEDED. 04/25/16  Yes [provider]  telmisartan (MICARDIS) 80 MG tablet Take 80 mg by mouth every morning.  03/17/17  Yes [provider]  UNABLE TO FIND 1 Dose daily. Med Name: "SUPER CREAM"  [place] 1/2 TUBE OF DESITIN AND 1/2 TUBE NYSTATIN WITH AN ENTIRE TUB OF AQUAPHOR AND APPLY TO VULVAR AREA   Yes [provider]  vitamin B-12 (CYANOCOBALAMIN) 1000 MCG tablet Take 1,000 mcg by mouth daily.    Yes [provider]  docusate sodium (COLACE) 100 MG capsule Take 1 capsule (100 mg total) by mouth daily as needed. 10/07/17 10/07/18  Lovell Sheehan, MD  ranitidine (ZANTAC) 150 MG tablet Take 150 mg by mouth at bedtime.     [provider]  traMADol (ULTRAM) 50 MG tablet Take 1 tablet (50 mg total) by mouth every 6 (six) hours as needed. 10/07/17 10/07/18  Lovell Sheehan, MD  triamcinolone ointment (KENALOG) 0.5 % Apply 1 application topically daily. 07/25/17   [provider]     Positive ROS: All other systems have been reviewed and were otherwise negative with the exception of those mentioned in the HPI and as above.  Physical Exam: General: Alert, no acute distress Cardiovascular: Regular rate and rhythm, no murmurs rubs or gallops.  No pedal edema Respiratory: Clear to auscultation bilaterally, no wheezes rales or rhonchi. No cyanosis, no use of accessory musculature GI: No organomegaly, abdomen is soft and non-tender nondistended with positive bowel sounds. Skin: Skin intact, no lesions within the operative field. Neurologic: Sensation intact distally Psychiatric: Patient is competent for consent with normal mood and affect Lymphatic: No axillary or cervical lymphadenopathy  MUSCULOSKELETAL: right wrist with pain, swelling and deformity Good cap refill  Assessment: S52.531A Colles fracture of right radius, init for clos fx  Plan: Plan  for Procedure(s): OPEN REDUCTION INTERNAL FIXATION (ORIF) DISTAL RADIAL FRACTURE  I discussed the risks and benefits of surgery. The risks include but are not limited to infection, bleeding requiring blood transfusion, nerve or blood vessel injury, joint stiffness or loss of motion, persistent pain, weakness or instability, malunion, nonunion and hardware failure and the need for further surgery. Medical risks include but are not limited to DVT and pulmonary embolism, myocardial infarction, stroke, pneumonia, respiratory failure and death. Patient understood these risks and wished to proceed.   Lovell Sheehan, MD   10/20/2017 4:39 PM

## 2017-11-17 ENCOUNTER — Ambulatory Visit
Admission: RE | Admit: 2017-11-17 | Discharge: 2017-11-17 | Disposition: A | Discharge status: Home / Self Care | Payer: Medicare Other | Source: Ambulatory Visit | Attending: Internal Medicine | Admitting: Internal Medicine

## 2017-11-17 ENCOUNTER — Other Ambulatory Visit: Payer: Self-pay | Admitting: Internal Medicine

## 2017-11-17 DIAGNOSIS — R600 Localized edema: Secondary | ICD-10-CM | POA: Diagnosis present

## 2018-01-20 ENCOUNTER — Ambulatory Visit: Payer: Medicare Other | Admitting: Urology

## 2018-03-10 ENCOUNTER — Other Ambulatory Visit: Payer: Self-pay | Admitting: Internal Medicine

## 2018-03-10 DIAGNOSIS — Z1231 Encounter for screening mammogram for malignant neoplasm of breast: Secondary | ICD-10-CM

## 2018-03-11 ENCOUNTER — Other Ambulatory Visit: Payer: Self-pay | Admitting: Physical Medicine and Rehabilitation

## 2018-03-11 DIAGNOSIS — M5416 Radiculopathy, lumbar region: Secondary | ICD-10-CM

## 2018-03-19 ENCOUNTER — Ambulatory Visit
Admission: RE | Admit: 2018-03-19 | Discharge: 2018-03-19 | Disposition: A | Discharge status: Home / Self Care | Payer: Medicare Other | Source: Ambulatory Visit | Attending: Internal Medicine | Admitting: Internal Medicine

## 2018-03-19 DIAGNOSIS — Z1231 Encounter for screening mammogram for malignant neoplasm of breast: Secondary | ICD-10-CM | POA: Insufficient documentation

## 2018-03-26 ENCOUNTER — Ambulatory Visit
Admission: RE | Admit: 2018-03-26 | Discharge: 2018-03-26 | Disposition: A | Discharge status: Home / Self Care | Payer: Medicare Other | Source: Ambulatory Visit | Attending: Physical Medicine and Rehabilitation | Admitting: Physical Medicine and Rehabilitation

## 2018-03-26 DIAGNOSIS — M48061 Spinal stenosis, lumbar region without neurogenic claudication: Secondary | ICD-10-CM | POA: Diagnosis not present

## 2018-03-26 DIAGNOSIS — M5416 Radiculopathy, lumbar region: Secondary | ICD-10-CM | POA: Diagnosis not present

## 2018-09-09 ENCOUNTER — Telehealth (INDEPENDENT_AMBULATORY_CARE_PROVIDER_SITE_OTHER): Payer: Self-pay | Admitting: Vascular Surgery

## 2018-09-14 ENCOUNTER — Ambulatory Visit (INDEPENDENT_AMBULATORY_CARE_PROVIDER_SITE_OTHER): Payer: Self-pay | Admitting: Vascular Surgery

## 2018-09-15 ENCOUNTER — Telehealth (INDEPENDENT_AMBULATORY_CARE_PROVIDER_SITE_OTHER): Payer: Self-pay | Admitting: Vascular Surgery

## 2018-09-15 NOTE — Telephone Encounter (Signed)
Patient has not been seen in the office within the past 3 years but is having right calf pain,some swelling,and a bulging vein for the past month

## 2018-09-15 NOTE — Telephone Encounter (Signed)
She should be seen by Lucky Cowboy or Schnier as she is considered a new patient.  We can do a reflux on that right leg.

## 2018-09-21 ENCOUNTER — Other Ambulatory Visit (INDEPENDENT_AMBULATORY_CARE_PROVIDER_SITE_OTHER): Payer: Self-pay | Admitting: Vascular Surgery

## 2018-09-21 DIAGNOSIS — M7989 Other specified soft tissue disorders: Principal | ICD-10-CM

## 2018-09-21 DIAGNOSIS — M79661 Pain in right lower leg: Secondary | ICD-10-CM

## 2018-09-22 ENCOUNTER — Encounter (INDEPENDENT_AMBULATORY_CARE_PROVIDER_SITE_OTHER): Payer: Self-pay | Admitting: Vascular Surgery

## 2018-09-22 ENCOUNTER — Ambulatory Visit (INDEPENDENT_AMBULATORY_CARE_PROVIDER_SITE_OTHER): Payer: Medicare Other

## 2018-09-22 ENCOUNTER — Other Ambulatory Visit: Payer: Self-pay

## 2018-09-22 ENCOUNTER — Ambulatory Visit (INDEPENDENT_AMBULATORY_CARE_PROVIDER_SITE_OTHER): Payer: Medicare Other | Admitting: Vascular Surgery

## 2018-09-22 VITALS — BP 135/76 | HR 87 | Resp 12 | Ht 64.0 in | Wt 170.0 lb

## 2018-09-22 DIAGNOSIS — R600 Localized edema: Secondary | ICD-10-CM | POA: Diagnosis not present

## 2018-09-22 DIAGNOSIS — Z79899 Other long term (current) drug therapy: Secondary | ICD-10-CM | POA: Diagnosis not present

## 2018-09-22 DIAGNOSIS — M79661 Pain in right lower leg: Secondary | ICD-10-CM | POA: Diagnosis not present

## 2018-09-22 DIAGNOSIS — M1990 Unspecified osteoarthritis, unspecified site: Secondary | ICD-10-CM | POA: Diagnosis not present

## 2018-09-22 DIAGNOSIS — I83811 Varicose veins of right lower extremities with pain: Secondary | ICD-10-CM

## 2018-09-22 DIAGNOSIS — I10 Essential (primary) hypertension: Secondary | ICD-10-CM

## 2018-09-22 DIAGNOSIS — I83819 Varicose veins of unspecified lower extremities with pain: Secondary | ICD-10-CM

## 2018-09-22 DIAGNOSIS — M7989 Other specified soft tissue disorders: Principal | ICD-10-CM

## 2018-09-22 NOTE — Progress Notes (Signed)
Patient ID: Traci [name], female   DOB: Aug 21, 1932, 85 y.o.   MRN: [id number]  Chief Complaint  Patient presents with  . Follow-up    HPI Traci [name] is a 85 y.o. female.  I am asked to see the patient by Vance Peper for evaluation of right leg pain and swelling.  She has previously had a knee and hip replacement on the right side.  Over the past few weeks, she had been having worsening pain and swelling in the right lower leg.  The pain is described as an aching or heaviness in the right calf and ankle area.  She had previously been evaluated for DVT and found to be negative.  She has no previous history of DVT or superficial thrombophlebitis to her knowledge.  She recently had a cortisone injection which has helped things a little bit.  Given the discoloration, prominent varicosities, and swelling in the lower leg concern for venous disease was present.  She does have prominent varicosities particularly in the posterior calf and lower leg area.  She has previously had a vein stripping in the right leg.  Ultrasound shows an absent great saphenous vein with some reflux in the deep venous system and prominent varicosities but no DVT or superficial thrombophlebitis.     Past Medical History:  Diagnosis Date  . Acid reflux   . Anxiety   . Arthritis   . Bruises easily   . Cancer ([place]) skin   Squamous cell skin cancer  . Depression   . Headache    H/O YEARS AGO  . Hyperlipidemia   . Hypertension   . Lumbar pain     Past Surgical History:  Procedure Laterality Date  . BLADDER SURGERY     Prolapse bladder, bladder sling surgery  . CATARACT EXTRACTION    . CHOLECYSTECTOMY    . HIP ARTHROPLASTY    . JOINT REPLACEMENT Bilateral    HIP REPLACMENT/ KNEE REPLACEMENT  . OPEN REDUCTION INTERNAL FIXATION (ORIF) DISTAL RADIAL FRACTURE Right 10/07/2017   Procedure: OPEN REDUCTION INTERNAL FIXATION (ORIF) DISTAL RADIAL FRACTURE;  Surgeon: Lovell Sheehan, MD;  Location: [facility] ORS;   Service: Orthopedics;  Laterality: Right;    Family History Family History  Problem Relation Age of Onset  . Kidney disease Brother   . AAA (abdominal aortic aneurysm) Neg Hx   No bleeding or clotting disorders No porphyria  Social History Social History   Tobacco Use  . Smoking status: Never Smoker  . Smokeless tobacco: Never Used  Substance Use Topics  . Alcohol use: No  . Drug use: No    Allergies  Allergen Reactions  . Adhesive [Tape]     PT HAS VERY SENSITIVE SKIN AND WILL BECOME IRRITATED    Current Outpatient Medications  Medication Sig Dispense Refill  . acetaminophen (TYLENOL) 500 MG tablet Take 500 mg by mouth every 4 (four) hours.     . ALPRAZolam (XANAX) 0.25 MG tablet TAKE ONE TABLET BY MOUTH DAILY-AM    . amLODipine (NORVASC) 10 MG tablet TAKE 0.5 TABLET EACH DAY-AM    . Ascorbic Acid (VITAMIN C) 1000 MG tablet Take 1,000 mg by mouth daily.     . Cholecalciferol (VITAMIN D) 2000 units tablet Take 2,000 Units by mouth daily.     Marland Kitchen conjugated estrogens (PREMARIN) vaginal cream Place 1 Applicatorful vaginally 3 (three) times a week.     . furosemide (LASIX) 20 MG tablet TAKE ONE (1) TABLET EACH DAY FOR EDEMA    .  mirabegron ER (MYRBETRIQ) 50 MG TB24 tablet Take 50 mg by mouth 3 (three) times a week.     . pantoprazole (PROTONIX) 40 MG tablet TAKE ONE TABLET BY MOUTH DAILY-AM    . pramipexole (MIRAPEX) 0.5 MG tablet TAKE ONE TABLET BY MOUTH EVERY NIGHT AT BEDTIME, MAY TAKE EXTRA 1/2 TABLET IF NEEDED.    . ranitidine (ZANTAC) 150 MG tablet Take 150 mg by mouth at bedtime.     Marland Kitchen telmisartan (MICARDIS) 80 MG tablet Take 80 mg by mouth every morning.     . traMADol (ULTRAM) 50 MG tablet Take 1 tablet (50 mg total) by mouth every 6 (six) hours as needed. 20 tablet 0  . vitamin B-12 (CYANOCOBALAMIN) 1000 MCG tablet Take 1,000 mcg by mouth daily.     Marland Kitchen docusate sodium (COLACE) 100 MG capsule Take 1 capsule (100 mg total) by mouth daily as needed. (Patient not taking:  Reported on 09/22/2018) 30 capsule 2  . ibuprofen (ADVIL,MOTRIN) 600 MG tablet Take 1 tablet (600 mg total) by mouth every 8 (eight) hours as needed. (Patient not taking: Reported on 09/22/2018) 20 tablet 0  . triamcinolone ointment (KENALOG) 0.5 % Apply 1 application topically daily.    Marland Kitchen UNABLE TO FIND 1 Dose daily. Med Name: "SUPER CREAM"  [place] 1/2 TUBE OF DESITIN AND 1/2 TUBE NYSTATIN WITH AN ENTIRE TUB OF AQUAPHOR AND APPLY TO VULVAR AREA     No current facility-administered medications for this visit.       REVIEW OF SYSTEMS (Negative unless checked)  Constitutional: [] Weight loss  [] Fever  [] Chills Cardiac: [] Chest pain   [] Chest pressure   [] Palpitations   [] Shortness of breath when laying flat   [] Shortness of breath at rest   [] Shortness of breath with exertion. Vascular:  [] Pain in legs with walking   [] Pain in legs at rest   [] Pain in legs when laying flat   [] Claudication   [] Pain in feet when walking  [] Pain in feet at rest  [] Pain in feet when laying flat   [] History of DVT   [] Phlebitis   [x] Swelling in legs   [x] Varicose veins   [] Non-healing ulcers Pulmonary:   [] Uses home oxygen   [] Productive cough   [] Hemoptysis   [] Wheeze  [] COPD   [] Asthma Neurologic:  [] Dizziness  [] Blackouts   [] Seizures   [] History of stroke   [] History of TIA  [] Aphasia   [] Temporary blindness   [] Dysphagia   [] Weakness or numbness in arms   [] Weakness or numbness in legs Musculoskeletal:  [x] Arthritis   [] Joint swelling   [x] Joint pain   [] Low back pain Hematologic:  [] Easy bruising  [] Easy bleeding   [] Hypercoagulable state   [] Anemic  [] Hepatitis Gastrointestinal:  [] Blood in stool   [] Vomiting blood  [] Gastroesophageal reflux/heartburn   [] Abdominal pain Genitourinary:  [] Chronic kidney disease   [] Difficult urination  [] Frequent urination  [] Burning with urination   [] Hematuria Skin:  [] Rashes   [] Ulcers   [] Wounds Psychological:  [] History of anxiety   []  History of major depression.    Physical  Exam BP 135/76 (BP Location: Left Arm, Patient Position: Sitting, Cuff Size: Large)   Pulse 87   Resp 12   Ht 5\' 4"  (1.626 m)   Wt 170 lb (77.1 kg)   BMI 29.18 kg/m  Gen:  WD/WN, NAD Head: /AT, No temporalis wasting.  Ear/Nose/Throat: Hearing grossly intact, nares w/o erythema or drainage, oropharynx w/o Erythema/Exudate Eyes: Conjunctiva clear, sclera non-icteric  Neck: trachea midline.  No JVD.  Pulmonary:  Good air movement, respirations not labored, no use of accessory muscles  Cardiac: RRR, no JVD Vascular: 2-3 mm varicosities in the right lower leg, the most prominent are posterior Vessel Right Left  Radial Palpable Palpable                          DP 2+  2+  PT 1+ 1+   Gastrointestinal:. No masses, surgical incisions, or scars. Musculoskeletal: M/S 5/5 throughout.  Extremities without ischemic changes.  No deformity or atrophy. 1+ RLE edema. Neurologic: Sensation grossly intact in extremities.  Symmetrical.  Speech is fluent. Motor exam as listed above. Psychiatric: Judgment intact, Mood & affect appropriate for pt's clinical situation. Dermatologic: No rashes or ulcers noted.  No cellulitis or open wounds.    Radiology No results found.  Labs No results found for this or any previous visit (from the past 2160 hour(s)).  Assessment/Plan:  Arthritis Contributing to pain and swelling in the leg but likely not the primary issue  Essential hypertension blood pressure control important in reducing the progression of atherosclerotic disease. On appropriate oral medications.   Varicose veins with pain Ultrasound shows an absent great saphenous vein with some reflux in the deep venous system and prominent varicosities but no DVT or superficial thrombophlebitis.  She does have prominent varicosities in the leg that are painful and bothersome.  She has long been wearing compression stockings and elevating her legs.  She has had previous vein stripping in the right  great saphenous vein system.  Foam sclerotherapy would be an excellent treatment option for these prominent painful varicosities on the right leg.  She will continue to wear her compression stockings and elevate her legs.      Leotis Pain 09/23/2018, 10:46 AM   This note was created with Dragon medical transcription system.  Any errors from dictation are unintentional.

## 2018-09-23 DIAGNOSIS — M1990 Unspecified osteoarthritis, unspecified site: Secondary | ICD-10-CM | POA: Insufficient documentation

## 2018-09-23 DIAGNOSIS — I83819 Varicose veins of unspecified lower extremities with pain: Secondary | ICD-10-CM | POA: Insufficient documentation

## 2018-09-23 DIAGNOSIS — I10 Essential (primary) hypertension: Secondary | ICD-10-CM | POA: Insufficient documentation

## 2018-09-23 NOTE — Assessment & Plan Note (Signed)
Ultrasound shows an absent great saphenous vein with some reflux in the deep venous system and prominent varicosities but no DVT or superficial thrombophlebitis.  She does have prominent varicosities in the leg that are painful and bothersome.  She has long been wearing compression stockings and elevating her legs.  She has had previous vein stripping in the right great saphenous vein system.  Foam sclerotherapy would be an excellent treatment option for these prominent painful varicosities on the right leg.  She will continue to wear her compression stockings and elevate her legs.

## 2018-09-23 NOTE — Patient Instructions (Signed)
Sclerotherapy  Sclerotherapy is a procedure that is done to improve the appearance of varicose veins and spider veins and to help relieve aching, swelling, cramping, and pain in the legs. Varicose veins are veins that have become enlarged, bulging, and twisted due to a damaged valve that causes blood to collect (pool) in the veins. Spider veins are small varicose veins. Sclerotherapy usually works best for smaller spider and varicose veins. This procedure involves injecting a chemical into the vein to close it off. You may need more than one treatment to close a vein all the way. Sclerotherapy is usually performed on the legs because that is where varicose and spider veins most often occur. Tell a health care provider about:  Any allergies you have.  All medicines you are taking, including vitamins, herbs, eye drops, creams, and over-the-counter medicines.  Any blood disorders you have.  Any surgeries you have had.  Any medical conditions you have.  Whether you are pregnant or may be pregnant. What are the risks? Generally, this is a safe procedure. However, problems may occur, including:  Infection.  Bleeding.  Allergic reactions to medicines or dyes.  Blood clots.  Nerve damage.  Bruising and scarring.  Darkened skin around the area. What happens before the procedure?  Do not use lotions or creams on your legs unless your health care provider approves.  Follow instructions from your health care provider about eating and drinking restrictions.  Do not use any products that contain nicotine or tobacco, such as cigarettes and e-cigarettes. If you need help quitting, ask your health care provider.  Ask your health care provider about: ? Changing or stopping your regular medicines. This is especially important if you are taking diabetes medicines or blood thinners. ? Taking medicines such as aspirin and ibuprofen. These medicines can thin your blood. Do not take these  medicines before your procedure if your health care provider instructs you not to.  You may have an ultrasound of the affected area to check for blood clots and to check blood flow.  In rare cases, you may have an X-ray procedure to check how blood flows through your veins (angiogram). For an angiogram, a dye is injected to outline your veins on X-rays. What happens during the procedure?  To lower your risk of infection: ? Your health care team will wash or sanitize their hands. ? Your skin will be washed with soap. ? Hair may be removed from the treatment area.  A small, thin needle will be used to inject a chemical (sclerosant) into your varicose vein. The sclerosant will irritate the lining of the vein and cause the vein to close below the injection site. You may feel some stinging, burning, or irritation.  The injection may be repeated for more than one varicose vein.  The injection area will be wrapped with elastic bandages. The procedure may vary among health care providers and hospitals. What happens after the procedure?  Your injection area will be wrapped with elastic bandages. If there is bleeding, the bandages may be changed.  Do not drive until your health care provider approves. You may need to wait 1-2 days before driving.  You will need to wear compression stockings for about a week, or as long as your health care provider recommends. Summary  Sclerotherapy is a procedure that is done to improve the appearance of varicose veins and spider veins and to help relieve aching, swelling, cramping, and pain in the legs.  A small, thin needle is   used to inject a chemical (sclerosant) into a spider vein or varicose vein to close it off.  Elastic bandages will be wrapped around the injection area after the procedure. This information is not intended to replace advice given to you by your health care provider. Make sure you discuss any questions you have with your health care  provider. Document Released: 08/27/2016 Document Revised: 08/27/2016 Document Reviewed: 08/27/2016 Elsevier Interactive Patient Education  2019 [employer]  

## 2018-09-23 NOTE — Assessment & Plan Note (Signed)
blood pressure control important in reducing the progression of atherosclerotic disease. On appropriate oral medications.  

## 2018-09-23 NOTE — Assessment & Plan Note (Signed)
Contributing to pain and swelling in the leg but likely not the primary issue

## 2018-11-25 ENCOUNTER — Other Ambulatory Visit: Payer: Self-pay | Admitting: Student

## 2018-11-25 DIAGNOSIS — R140 Abdominal distension (gaseous): Secondary | ICD-10-CM

## 2018-11-25 DIAGNOSIS — R1310 Dysphagia, unspecified: Secondary | ICD-10-CM

## 2018-11-25 DIAGNOSIS — R109 Unspecified abdominal pain: Secondary | ICD-10-CM

## 2018-11-25 DIAGNOSIS — K219 Gastro-esophageal reflux disease without esophagitis: Secondary | ICD-10-CM

## 2018-11-26 ENCOUNTER — Other Ambulatory Visit: Payer: Self-pay | Admitting: Student

## 2018-11-26 DIAGNOSIS — R899 Unspecified abnormal finding in specimens from other organs, systems and tissues: Secondary | ICD-10-CM

## 2018-11-26 DIAGNOSIS — N289 Disorder of kidney and ureter, unspecified: Secondary | ICD-10-CM

## 2018-11-26 DIAGNOSIS — R140 Abdominal distension (gaseous): Secondary | ICD-10-CM

## 2018-11-27 ENCOUNTER — Other Ambulatory Visit: Payer: Medicare Other

## 2018-11-30 ENCOUNTER — Ambulatory Visit
Admission: RE | Admit: 2018-11-30 | Discharge: 2018-11-30 | Disposition: A | Discharge status: Home / Self Care | Payer: Medicare Other | Source: Ambulatory Visit | Attending: Student | Admitting: Student

## 2018-12-02 ENCOUNTER — Ambulatory Visit
Admission: RE | Admit: 2018-12-02 | Discharge: 2018-12-02 | Disposition: A | Discharge status: Home / Self Care | Payer: Medicare Other | Source: Ambulatory Visit | Attending: Student | Admitting: Student

## 2018-12-02 ENCOUNTER — Other Ambulatory Visit: Payer: Self-pay

## 2018-12-02 DIAGNOSIS — N289 Disorder of kidney and ureter, unspecified: Secondary | ICD-10-CM | POA: Diagnosis present

## 2018-12-02 DIAGNOSIS — R140 Abdominal distension (gaseous): Secondary | ICD-10-CM | POA: Diagnosis not present

## 2018-12-02 DIAGNOSIS — R899 Unspecified abnormal finding in specimens from other organs, systems and tissues: Secondary | ICD-10-CM

## 2018-12-02 LAB — POCT I-STAT CREATININE: Creatinine, Ser: 0.9 mg/dL (ref 0.44–1.00)

## 2018-12-02 MED ORDER — IOHEXOL 300 MG/ML  SOLN
100.0000 mL | Freq: Once | INTRAMUSCULAR | Status: AC | PRN
  Administered 2018-12-02: 16:00:00 100 mL via INTRAVENOUS

## 2018-12-07 ENCOUNTER — Other Ambulatory Visit: Payer: Self-pay | Admitting: Student

## 2018-12-07 ENCOUNTER — Other Ambulatory Visit: Payer: Self-pay

## 2018-12-07 ENCOUNTER — Ambulatory Visit
Admission: RE | Admit: 2018-12-07 | Discharge: 2018-12-07 | Disposition: A | Discharge status: Home / Self Care | Payer: Medicare Other | Source: Ambulatory Visit | Attending: Student | Admitting: Student

## 2018-12-07 DIAGNOSIS — R140 Abdominal distension (gaseous): Secondary | ICD-10-CM | POA: Diagnosis present

## 2018-12-07 DIAGNOSIS — R109 Unspecified abdominal pain: Secondary | ICD-10-CM

## 2018-12-07 DIAGNOSIS — K219 Gastro-esophageal reflux disease without esophagitis: Secondary | ICD-10-CM | POA: Insufficient documentation

## 2018-12-07 DIAGNOSIS — R1310 Dysphagia, unspecified: Secondary | ICD-10-CM | POA: Insufficient documentation

## 2019-07-27 DIAGNOSIS — D692 Other nonthrombocytopenic purpura: Secondary | ICD-10-CM | POA: Insufficient documentation

## 2019-12-31 DIAGNOSIS — I493 Ventricular premature depolarization: Secondary | ICD-10-CM | POA: Insufficient documentation

## 2020-03-22 ENCOUNTER — Ambulatory Visit: Payer: Medicare Other | Admitting: Urology

## 2020-06-12 ENCOUNTER — Other Ambulatory Visit: Payer: Self-pay

## 2020-06-12 ENCOUNTER — Ambulatory Visit
Admission: RE | Admit: 2020-06-12 | Discharge: 2020-06-12 | Disposition: A | Discharge status: Home / Self Care | Payer: Medicare PPO | Source: Ambulatory Visit | Attending: Family Medicine | Admitting: Family Medicine

## 2020-06-12 ENCOUNTER — Other Ambulatory Visit: Payer: Self-pay | Admitting: Family Medicine

## 2020-06-12 DIAGNOSIS — R600 Localized edema: Secondary | ICD-10-CM

## 2020-06-23 ENCOUNTER — Other Ambulatory Visit: Payer: Self-pay | Admitting: Family Medicine

## 2020-06-23 ENCOUNTER — Other Ambulatory Visit
Admission: RE | Admit: 2020-06-23 | Discharge: 2020-06-23 | Disposition: A | Discharge status: Home / Self Care | Payer: Medicare PPO | Source: Ambulatory Visit | Attending: Family Medicine | Admitting: Family Medicine

## 2020-06-23 ENCOUNTER — Other Ambulatory Visit: Payer: Self-pay

## 2020-06-23 ENCOUNTER — Ambulatory Visit
Admission: RE | Admit: 2020-06-23 | Discharge: 2020-06-23 | Disposition: A | Discharge status: Home / Self Care | Payer: Medicare PPO | Source: Ambulatory Visit | Attending: Family Medicine | Admitting: Family Medicine

## 2020-06-23 DIAGNOSIS — R0602 Shortness of breath: Secondary | ICD-10-CM | POA: Insufficient documentation

## 2020-06-23 DIAGNOSIS — R7989 Other specified abnormal findings of blood chemistry: Secondary | ICD-10-CM

## 2020-06-23 LAB — TROPONIN I (HIGH SENSITIVITY): Troponin I (High Sensitivity): 7 ng/L (ref <18)

## 2020-06-23 LAB — FIBRIN DERIVATIVES D-DIMER (ARMC ONLY): Fibrin derivatives D-dimer (ARMC): 1392.73 ng/mL (FEU) — ABNORMAL HIGH (ref 0.00–499.00)

## 2020-06-23 LAB — BRAIN NATRIURETIC PEPTIDE: B Natriuretic Peptide: 351.8 pg/mL — ABNORMAL HIGH (ref 0.0–100.0)

## 2020-06-23 MED ORDER — IOHEXOL 350 MG/ML SOLN
75.0000 mL | Freq: Once | INTRAVENOUS | Status: AC | PRN
  Administered 2020-06-23: 75 mL via INTRAVENOUS

## 2020-06-27 ENCOUNTER — Encounter (INDEPENDENT_AMBULATORY_CARE_PROVIDER_SITE_OTHER): Payer: Self-pay | Admitting: Nurse Practitioner

## 2020-06-27 ENCOUNTER — Ambulatory Visit (INDEPENDENT_AMBULATORY_CARE_PROVIDER_SITE_OTHER): Payer: Medicare PPO | Admitting: Nurse Practitioner

## 2020-06-27 ENCOUNTER — Ambulatory Visit (INDEPENDENT_AMBULATORY_CARE_PROVIDER_SITE_OTHER): Payer: Medicare Other | Admitting: Nurse Practitioner

## 2020-06-27 ENCOUNTER — Other Ambulatory Visit: Payer: Self-pay

## 2020-06-27 VITALS — BP 151/82 | HR 77 | Resp 16 | Wt 167.8 lb

## 2020-06-27 DIAGNOSIS — R102 Pelvic and perineal pain: Secondary | ICD-10-CM | POA: Insufficient documentation

## 2020-06-27 DIAGNOSIS — M79605 Pain in left leg: Secondary | ICD-10-CM | POA: Diagnosis not present

## 2020-06-27 DIAGNOSIS — I10 Essential (primary) hypertension: Secondary | ICD-10-CM | POA: Diagnosis not present

## 2020-06-27 DIAGNOSIS — M79604 Pain in right leg: Secondary | ICD-10-CM

## 2020-06-27 DIAGNOSIS — I83819 Varicose veins of unspecified lower extremities with pain: Secondary | ICD-10-CM

## 2020-06-28 DIAGNOSIS — I712 Thoracic aortic aneurysm, without rupture, unspecified: Secondary | ICD-10-CM | POA: Insufficient documentation

## 2020-06-29 ENCOUNTER — Telehealth (INDEPENDENT_AMBULATORY_CARE_PROVIDER_SITE_OTHER): Payer: Self-pay

## 2020-06-29 NOTE — Telephone Encounter (Signed)
Typically insurance wants an ultrasound no older than a year. So even if we wanted to proceed with sclerotherapy, because it has been well over one year since the last scan, we would need to repeat to apply for the treatment

## 2020-06-29 NOTE — Telephone Encounter (Signed)
Pt left a VM on the nurse's line wanting to know since she was seen on 09/22/18 for a Vascular venous U/S Rt LE and per Dr. Lucky Cowboy no DVT was found pt was recommended to do foam sclero and elevate her leg pt has not yet had sclero an might need to be scheduled per her chart . Does the pt still need to come in for her appointment for  U/S on the 07/09/20 since she had the U/S on 09/22/18  Please advise.

## 2020-06-30 NOTE — Telephone Encounter (Signed)
Called pt and made them aware of the Np instructions.

## 2020-07-02 ENCOUNTER — Encounter (INDEPENDENT_AMBULATORY_CARE_PROVIDER_SITE_OTHER): Payer: Self-pay | Admitting: Nurse Practitioner

## 2020-07-02 NOTE — Progress Notes (Signed)
Subjective:    Patient ID: Traci [name], female    DOB: 1932-10-19, 86 y.o.   MRN: [id number] Chief Complaint  Patient presents with  . Follow-up    ref [place] edema    The patient presents today for evaluation of lower extremity edema.  The patient notes that her edema is generally well controlled however recently she began to have worsening swelling after stopping Lasix.  She was also placed on a course of Keflex following her extensive swelling and since restarting the Lasix her swelling has decreased.  The patient does not have a known history of heart failure however she does have an upcoming appointment with for an echocardiogram.  The patient also notes that some dietary changes may be the cause for this that she has been eating excessive amount of pickles.  She denies any fever, chills, nausea, vomiting or diarrhea.  She denies any chest pain or shortness of breath.   Review of Systems  Cardiovascular: Positive for leg swelling.  All other systems reviewed and are negative.      Objective:   Physical Exam Vitals reviewed.  HENT:     Head: Normocephalic.  Cardiovascular:     Rate and Rhythm: Normal rate.     Pulses: Normal pulses.  Pulmonary:     Effort: Pulmonary effort is normal.  Musculoskeletal:     Right lower leg: Edema present.     Left lower leg: Edema present.  Neurological:     Mental Status: She is alert and oriented to person, place, and time.  Psychiatric:        Mood and Affect: Mood normal.        Behavior: Behavior normal.        Thought Content: Thought content normal.        Judgment: Judgment normal.     BP (!) 151/82 (BP Location: Right Arm)   Pulse 77   Resp 16   Wt 167 lb 12.8 oz (76.1 kg)   BMI 28.80 kg/m   Past Medical History:  Diagnosis Date  . Acid reflux   . Anxiety   . Arthritis   . Bruises easily   . Cancer ([place]) skin   Squamous cell skin cancer  . Depression   . Headache    H/O YEARS AGO  . Hyperlipidemia   .  Hypertension   . Lumbar pain     Social History   Socioeconomic History  . Marital status: Married    Spouse name: Not on file  . Number of children: Not on file  . Years of education: Not on file  . Highest education level: Not on file  Occupational History  . Not on file  Tobacco Use  . Smoking status: Never Smoker  . Smokeless tobacco: Never Used  Vaping Use  . Vaping Use: Never used  Substance and Sexual Activity  . Alcohol use: No  . Drug use: No  . Sexual activity: Not on file  Other Topics Concern  . Not on file  Social History Narrative  . Not on file   Social Determinants of Health   Financial Resource Strain: Not on file  Food Insecurity: Not on file  Transportation Needs: Not on file  Physical Activity: Not on file  Stress: Not on file  Social Connections: Not on file  Intimate Partner Violence: Not on file    Past Surgical History:  Procedure Laterality Date  . BLADDER SURGERY     Prolapse bladder, bladder  sling surgery  . CATARACT EXTRACTION    . CHOLECYSTECTOMY    . HIP ARTHROPLASTY    . JOINT REPLACEMENT Bilateral    HIP REPLACMENT/ KNEE REPLACEMENT  . OPEN REDUCTION INTERNAL FIXATION (ORIF) DISTAL RADIAL FRACTURE Right 10/07/2017   Procedure: OPEN REDUCTION INTERNAL FIXATION (ORIF) DISTAL RADIAL FRACTURE;  Surgeon: Lovell Sheehan, MD;  Location: [facility] ORS;  Service: Orthopedics;  Laterality: Right;    Family History  Problem Relation Age of Onset  . Kidney disease Brother   . AAA (abdominal aortic aneurysm) Neg Hx     Allergies  Allergen Reactions  . Adhesive [Tape]     PT HAS VERY SENSITIVE SKIN AND WILL BECOME IRRITATED    CBC Latest Ref Rng & Units 10/07/2017 07/06/2014 07/05/2014  WBC 3.6 - 11.0 K/uL 7.9 - -  Hemoglobin 12.0 - 16.0 g/dL 13.9 11.2(L) 11.1(L)  Hematocrit 35.0 - 47.0 % 41.2 - -  Platelets 150 - 440 K/uL 185 117(L) 132(L)      CMP     Component Value Date/Time   NA 140 10/07/2017 0944   NA 138 07/06/2014 0530    K 4.0 10/07/2017 0944   K 3.7 07/06/2014 0530   CL 106 10/07/2017 0944   CL 109 (H) 07/06/2014 0530   CO2 26 10/07/2017 0944   CO2 24 07/06/2014 0530   GLUCOSE 91 10/07/2017 0944   GLUCOSE 100 (H) 07/06/2014 0530   BUN 23 (H) 10/07/2017 0944   BUN 10 07/06/2014 0530   CREATININE 0.90 12/02/2018 1559   CREATININE 0.76 07/06/2014 0530   CALCIUM 9.0 10/07/2017 0944   CALCIUM 7.7 (L) 07/06/2014 0530   GFRNONAA 49 (L) 10/07/2017 0944   GFRNONAA >60 07/06/2014 0530   GFRNONAA 56 (L) 10/22/2013 0551   GFRAA 57 (L) 10/07/2017 0944   GFRAA >60 07/06/2014 0530   GFRAA >60 10/22/2013 0551     No results found.     Assessment & Plan:   1. Varicose veins with pain The patient has had previous treatment her varicosities.  We will reevaluate her varicosities for possible recannulation of her varicosities.  Previously the patient was need of foam sclerotherapy we can also discuss this at her follow-up visit as well.  2. Benign essential hypertension Continue antihypertensive medications as already ordered, these medications have been reviewed and there are no changes at this time.   3. Pain in both lower extremities  Recommend:  The patient has atypical pain symptoms for pure atherosclerotic disease. However, on physical exam there is evidence of mixed venous and arterial disease, given the diminished pulses and the edema associated with venous changes of the legs.  Noninvasive studies including ABI's and venous ultrasound of the legs will be obtained and the patient will follow up with me to review these studies.  I suspect the patient is c/o pseudoclaudication.  Patient should have an evaluation of his LS spine which I defer to the primary service.  The patient should continue walking and begin a more formal exercise program. The patient should continue his antiplatelet therapy and aggressive treatment of the lipid abnormalities.  The patient should begin wearing graduated  compression socks 15-20 mmHg strength to control edema.    Current Outpatient Medications on File Prior to Visit  Medication Sig Dispense Refill  . acetaminophen (TYLENOL) 500 MG tablet Take 500 mg by mouth every 4 (four) hours.     . ALPRAZolam (XANAX) 0.25 MG tablet TAKE ONE TABLET BY MOUTH DAILY-AM    .  amLODipine (NORVASC) 10 MG tablet TAKE 0.5 TABLET EACH DAY-AM    . Ascorbic Acid (VITAMIN C) 1000 MG tablet Take 1,000 mg by mouth daily.     . Cholecalciferol (VITAMIN D) 2000 units tablet Take 2,000 Units by mouth daily.     Marland Kitchen conjugated estrogens (PREMARIN) vaginal cream Place 1 Applicatorful vaginally 3 (three) times a week.     . furosemide (LASIX) 20 MG tablet 40 mg daily.     Marland Kitchen ibuprofen (ADVIL,MOTRIN) 600 MG tablet Take 1 tablet (600 mg total) by mouth every 8 (eight) hours as needed. 20 tablet 0  . mirabegron ER (MYRBETRIQ) 50 MG TB24 tablet Take 50 mg by mouth 3 (three) times a week.     . pantoprazole (PROTONIX) 40 MG tablet TAKE ONE TABLET BY MOUTH DAILY-AM    . pramipexole (MIRAPEX) 0.5 MG tablet TAKE ONE TABLET BY MOUTH EVERY NIGHT AT BEDTIME, MAY TAKE EXTRA 1/2 TABLET IF NEEDED.    Marland Kitchen telmisartan (MICARDIS) 80 MG tablet Take 80 mg by mouth every morning.     . traMADol (ULTRAM) 50 MG tablet     . triamcinolone ointment (KENALOG) 0.5 % Apply 1 application topically daily.    Marland Kitchen UNABLE TO FIND 1 Dose daily. Med Name: "SUPER CREAM"  MIX 1/2 TUBE OF DESITIN AND 1/2 TUBE NYSTATIN WITH AN ENTIRE TUB OF AQUAPHOR AND APPLY TO VULVAR AREA    . vitamin B-12 (CYANOCOBALAMIN) 1000 MCG tablet Take 1,000 mcg by mouth daily.      No current facility-administered medications on file prior to visit.    There are no Patient Instructions on file for this visit. No follow-ups on file.   Kris Hartmann, NP

## 2020-07-13 ENCOUNTER — Other Ambulatory Visit (INDEPENDENT_AMBULATORY_CARE_PROVIDER_SITE_OTHER): Payer: Self-pay | Admitting: Nurse Practitioner

## 2020-07-13 DIAGNOSIS — M79606 Pain in leg, unspecified: Secondary | ICD-10-CM

## 2020-07-13 DIAGNOSIS — M7989 Other specified soft tissue disorders: Secondary | ICD-10-CM

## 2020-07-13 DIAGNOSIS — I83819 Varicose veins of unspecified lower extremities with pain: Secondary | ICD-10-CM

## 2020-07-19 ENCOUNTER — Encounter (INDEPENDENT_AMBULATORY_CARE_PROVIDER_SITE_OTHER): Payer: Self-pay | Admitting: Nurse Practitioner

## 2020-07-19 ENCOUNTER — Ambulatory Visit (INDEPENDENT_AMBULATORY_CARE_PROVIDER_SITE_OTHER): Payer: Medicare PPO

## 2020-07-19 ENCOUNTER — Other Ambulatory Visit: Payer: Self-pay

## 2020-07-19 ENCOUNTER — Ambulatory Visit (INDEPENDENT_AMBULATORY_CARE_PROVIDER_SITE_OTHER): Payer: Medicare PPO | Admitting: Nurse Practitioner

## 2020-07-19 VITALS — BP 121/73 | HR 67 | Resp 16 | Ht 64.0 in | Wt 166.0 lb

## 2020-07-19 DIAGNOSIS — M7989 Other specified soft tissue disorders: Secondary | ICD-10-CM

## 2020-07-19 DIAGNOSIS — I83819 Varicose veins of unspecified lower extremities with pain: Secondary | ICD-10-CM | POA: Diagnosis not present

## 2020-07-19 DIAGNOSIS — M79606 Pain in leg, unspecified: Secondary | ICD-10-CM | POA: Diagnosis not present

## 2020-07-19 DIAGNOSIS — I10 Essential (primary) hypertension: Secondary | ICD-10-CM | POA: Diagnosis not present

## 2020-07-22 ENCOUNTER — Encounter (INDEPENDENT_AMBULATORY_CARE_PROVIDER_SITE_OTHER): Payer: Self-pay | Admitting: Nurse Practitioner

## 2020-07-22 NOTE — Progress Notes (Signed)
Subjective:    Patient ID: Traci [name], female    DOB: 1932-12-08, 87 y.o.   MRN: [id number] Chief Complaint  Patient presents with  . Follow-up    ultrasound    The patient returns to the office for followup evaluation regarding leg swelling.  The swelling has improved quite a bit and the pain associated with swelling has decreased substantially. There have not been any interval development of a ulcerations or wounds.  Since the previous visit the patient has been wearing graduated compression stockings and has noted little significant improvement in the lymphedema. The patient has been using compression routinely morning until night.  The patient also states elevation during the day and exercise is being done too.   Today the patient ABI 1.29 on the right and 1.35 on the left with triphasic tibial artery waveforms.  Patient had evidence of reflux in the left great saphenous vein at the proximal calf.  No evidence of DVT or superficial venous stenosis bilaterally.  No evidence of deep venous insufficiency seen bilaterally.  No evidence of reflux in the right lower extremity.   Review of Systems  Cardiovascular: Negative for leg swelling.  All other systems reviewed and are negative.      Objective:   Physical Exam Vitals reviewed.  HENT:     Head: Normocephalic.  Cardiovascular:     Rate and Rhythm: Normal rate.     Pulses: Normal pulses.  Pulmonary:     Effort: Pulmonary effort is normal.  Musculoskeletal:     Right lower leg: No edema.     Left lower leg: No edema.  Neurological:     Mental Status: She is alert and oriented to person, place, and time.  Psychiatric:        Mood and Affect: Mood normal.        Behavior: Behavior normal.        Thought Content: Thought content normal.        Judgment: Judgment normal.     BP 121/73 (BP Location: Right Arm)   Pulse 67   Resp 16   Ht 5\' 4"  (1.626 m)   Wt 166 lb (75.3 kg)   BMI 28.49 kg/m   Past Medical  History:  Diagnosis Date  . Acid reflux   . Anxiety   . Arthritis   . Bruises easily   . Cancer ([place]) skin   Squamous cell skin cancer  . Depression   . Headache    H/O YEARS AGO  . Hyperlipidemia   . Hypertension   . Lumbar pain     Social History   Socioeconomic History  . Marital status: Married    Spouse name: Not on file  . Number of children: Not on file  . Years of education: Not on file  . Highest education level: Not on file  Occupational History  . Not on file  Tobacco Use  . Smoking status: Never Smoker  . Smokeless tobacco: Never Used  Vaping Use  . Vaping Use: Never used  Substance and Sexual Activity  . Alcohol use: No  . Drug use: No  . Sexual activity: Not on file  Other Topics Concern  . Not on file  Social History Narrative  . Not on file   Social Determinants of Health   Financial Resource Strain: Not on file  Food Insecurity: Not on file  Transportation Needs: Not on file  Physical Activity: Not on file  Stress: Not on file  Social Connections: Not on file  Intimate Partner Violence: Not on file    Past Surgical History:  Procedure Laterality Date  . BLADDER SURGERY     Prolapse bladder, bladder sling surgery  . CATARACT EXTRACTION    . CHOLECYSTECTOMY    . HIP ARTHROPLASTY    . JOINT REPLACEMENT Bilateral    HIP REPLACMENT/ KNEE REPLACEMENT  . OPEN REDUCTION INTERNAL FIXATION (ORIF) DISTAL RADIAL FRACTURE Right 10/07/2017   Procedure: OPEN REDUCTION INTERNAL FIXATION (ORIF) DISTAL RADIAL FRACTURE;  Surgeon: Lovell Sheehan, MD;  Location: [facility] ORS;  Service: Orthopedics;  Laterality: Right;    Family History  Problem Relation Age of Onset  . Kidney disease Brother   . AAA (abdominal aortic aneurysm) Neg Hx     Allergies  Allergen Reactions  . Adhesive [Tape]     PT HAS VERY SENSITIVE SKIN AND WILL BECOME IRRITATED    CBC Latest Ref Rng & Units 10/07/2017 07/06/2014 07/05/2014  WBC 3.6 - 11.0 K/uL 7.9 - -  Hemoglobin 12.0  - 16.0 g/dL 13.9 11.2(L) 11.1(L)  Hematocrit 35.0 - 47.0 % 41.2 - -  Platelets 150 - 440 K/uL 185 117(L) 132(L)      CMP     Component Value Date/Time   NA 140 10/07/2017 0944   NA 138 07/06/2014 0530   K 4.0 10/07/2017 0944   K 3.7 07/06/2014 0530   CL 106 10/07/2017 0944   CL 109 (H) 07/06/2014 0530   CO2 26 10/07/2017 0944   CO2 24 07/06/2014 0530   GLUCOSE 91 10/07/2017 0944   GLUCOSE 100 (H) 07/06/2014 0530   BUN 23 (H) 10/07/2017 0944   BUN 10 07/06/2014 0530   CREATININE 0.90 12/02/2018 1559   CREATININE 0.76 07/06/2014 0530   CALCIUM 9.0 10/07/2017 0944   CALCIUM 7.7 (L) 07/06/2014 0530   GFRNONAA 49 (L) 10/07/2017 0944   GFRNONAA >60 07/06/2014 0530   GFRNONAA 56 (L) 10/22/2013 0551   GFRAA 57 (L) 10/07/2017 0944   GFRAA >60 07/06/2014 0530   GFRAA >60 10/22/2013 0551     No results found.     Assessment & Plan:   1. Varicose veins with pain Recommend:  The patient is complaining of varicose veins.    I have had a long discussion with the patient regarding  varicose veins and why they cause symptoms.  Patient will begin wearing graduated compression stockings on a daily basis, beginning first thing in the morning and removing them in the evening. The patient is instructed specifically not to sleep in the stockings.    The patient  will also begin using over-the-counter analgesics such as Motrin 600 mg po TID to help control the symptoms as needed.    In addition, behavioral modification including elevation during the day will be initiated, utilizing a recliner was recommended.  The patient is also instructed to continue exercising such as walking 4-5 times per week.  At this time the patient wishes to continue conservative therapy and is not interested in more invasive treatments such as laser ablation and sclerotherapy.  The Patient will follow up PRN if the symptoms worsen.  2. Leg swelling  No surgery or intervention at this point in time.    I  have reviewed my discussion with the patient regarding lymphedema and why it  causes symptoms.  Patient will continue wearing graduated compression stockings class 1 (20-30 mmHg) on a daily basis a prescription was given. The patient is reminded to put the stockings  on first thing in the morning and removing them in the evening. The patient is instructed specifically not to sleep in the stockings.   In addition, behavioral modification throughout the day will be continued.  This will include frequent elevation (such as in a recliner), use of over the counter pain medications as needed and exercise such as walking.  I have reviewed systemic causes for chronic edema such as liver, kidney and cardiac etiologies and there does not appear to be any significant changes in these organ systems over the past year.  The patient is under the impression that these organ systems are all stable and unchanged.       The patient will follow-up with me on an annual basis.    3. Benign essential hypertension Continue antihypertensive medications as already ordered, these medications have been reviewed and there are no changes at this time.   4. Pain of lower extremity, unspecified laterality Recommend:  I do not find evidence of Vascular pathology that would explain the patient's symptoms  The patient has atypical pain symptoms for vascular disease  I do not find evidence of Vascular pathology that would explain the patient's symptoms and I suspect the patient is c/o pseudoclaudication.  Patient should have an evaluation of his LS spine which I defer to the primary service.  Noninvasive studies including venous ultrasound of the legs do not identify vascular problems  The patient should continue walking and begin a more formal exercise program. The patient should continue his antiplatelet therapy and aggressive treatment of the lipid abnormalities. The patient should begin wearing graduated compression  socks 15-20 mmHg strength to control her mild edema.  Patient will follow-up with me on a PRN basis  Further work-up of her lower extremity pain is deferred to the primary service      Current Outpatient Medications on File Prior to Visit  Medication Sig Dispense Refill  . acetaminophen (TYLENOL) 500 MG tablet Take 500 mg by mouth every 4 (four) hours.     . ALPRAZolam (XANAX) 0.25 MG tablet TAKE ONE TABLET BY MOUTH DAILY-AM    . amLODipine (NORVASC) 10 MG tablet TAKE 0.5 TABLET EACH DAY-AM    . Ascorbic Acid (VITAMIN C) 1000 MG tablet Take 1,000 mg by mouth daily.     . Cholecalciferol (VITAMIN D) 2000 units tablet Take 2,000 Units by mouth daily.     Marland Kitchen conjugated estrogens (PREMARIN) vaginal cream Place 1 Applicatorful vaginally 3 (three) times a week.     . furosemide (LASIX) 20 MG tablet 40 mg daily.     Marland Kitchen ibuprofen (ADVIL,MOTRIN) 600 MG tablet Take 1 tablet (600 mg total) by mouth every 8 (eight) hours as needed. 20 tablet 0  . mirabegron ER (MYRBETRIQ) 50 MG TB24 tablet Take 50 mg by mouth 3 (three) times a week.     . pantoprazole (PROTONIX) 40 MG tablet TAKE ONE TABLET BY MOUTH DAILY-AM    . pramipexole (MIRAPEX) 0.5 MG tablet TAKE ONE TABLET BY MOUTH EVERY NIGHT AT BEDTIME, MAY TAKE EXTRA 1/2 TABLET IF NEEDED.    Marland Kitchen telmisartan (MICARDIS) 80 MG tablet Take 80 mg by mouth every morning.     . traMADol (ULTRAM) 50 MG tablet     . triamcinolone ointment (KENALOG) 0.5 % Apply 1 application topically daily.    Marland Kitchen UNABLE TO FIND 1 Dose daily. Med Name: "SUPER CREAM"  MIX 1/2 TUBE OF DESITIN AND 1/2 TUBE NYSTATIN WITH AN ENTIRE TUB OF AQUAPHOR AND APPLY TO  VULVAR AREA    . vitamin B-12 (CYANOCOBALAMIN) 1000 MCG tablet Take 1,000 mcg by mouth daily.      No current facility-administered medications on file prior to visit.    There are no Patient Instructions on file for this visit. No follow-ups on file.   Kris Hartmann, NP

## 2021-03-19 ENCOUNTER — Emergency Department: Payer: Medicare PPO

## 2021-03-19 ENCOUNTER — Other Ambulatory Visit: Payer: Self-pay

## 2021-03-19 ENCOUNTER — Emergency Department
Admission: EM | Admit: 2021-03-19 | Discharge: 2021-03-19 | Disposition: A | Discharge status: Home / Self Care | Payer: Medicare PPO | Attending: Emergency Medicine | Admitting: Emergency Medicine

## 2021-03-19 DIAGNOSIS — Z96653 Presence of artificial knee joint, bilateral: Secondary | ICD-10-CM | POA: Insufficient documentation

## 2021-03-19 DIAGNOSIS — S51811A Laceration without foreign body of right forearm, initial encounter: Secondary | ICD-10-CM | POA: Diagnosis not present

## 2021-03-19 DIAGNOSIS — S0083XA Contusion of other part of head, initial encounter: Secondary | ICD-10-CM | POA: Diagnosis not present

## 2021-03-19 DIAGNOSIS — W19XXXA Unspecified fall, initial encounter: Secondary | ICD-10-CM

## 2021-03-19 DIAGNOSIS — W01198A Fall on same level from slipping, tripping and stumbling with subsequent striking against other object, initial encounter: Secondary | ICD-10-CM | POA: Insufficient documentation

## 2021-03-19 DIAGNOSIS — I10 Essential (primary) hypertension: Secondary | ICD-10-CM | POA: Insufficient documentation

## 2021-03-19 DIAGNOSIS — Z79899 Other long term (current) drug therapy: Secondary | ICD-10-CM | POA: Insufficient documentation

## 2021-03-19 DIAGNOSIS — M25551 Pain in right hip: Secondary | ICD-10-CM | POA: Insufficient documentation

## 2021-03-19 DIAGNOSIS — S71111A Laceration without foreign body, right thigh, initial encounter: Secondary | ICD-10-CM | POA: Diagnosis not present

## 2021-03-19 DIAGNOSIS — M25531 Pain in right wrist: Secondary | ICD-10-CM | POA: Insufficient documentation

## 2021-03-19 DIAGNOSIS — Z85828 Personal history of other malignant neoplasm of skin: Secondary | ICD-10-CM | POA: Diagnosis not present

## 2021-03-19 DIAGNOSIS — S59911A Unspecified injury of right forearm, initial encounter: Secondary | ICD-10-CM | POA: Diagnosis present

## 2021-03-19 DIAGNOSIS — M25559 Pain in unspecified hip: Secondary | ICD-10-CM

## 2021-03-19 DIAGNOSIS — S81811A Laceration without foreign body, right lower leg, initial encounter: Secondary | ICD-10-CM | POA: Diagnosis not present

## 2021-03-19 LAB — CBC WITH DIFFERENTIAL/PLATELET
Abs Immature Granulocytes: 0.05 10*3/uL (ref 0.00–0.07)
Basophils Absolute: 0.1 10*3/uL (ref 0.0–0.1)
Basophils Relative: 1 %
Eosinophils Absolute: 0.1 10*3/uL (ref 0.0–0.5)
Eosinophils Relative: 1 %
HCT: 41.6 % (ref 36.0–46.0)
Hemoglobin: 14.5 g/dL (ref 12.0–15.0)
Immature Granulocytes: 1 %
Lymphocytes Relative: 18 %
Lymphs Abs: 1.7 10*3/uL (ref 0.7–4.0)
MCH: 32.6 pg (ref 26.0–34.0)
MCHC: 34.9 g/dL (ref 30.0–36.0)
MCV: 93.5 fL (ref 80.0–100.0)
Monocytes Absolute: 0.7 10*3/uL (ref 0.1–1.0)
Monocytes Relative: 7 %
Neutro Abs: 6.8 10*3/uL (ref 1.7–7.7)
Neutrophils Relative %: 72 %
Platelets: 191 10*3/uL (ref 150–400)
RBC: 4.45 MIL/uL (ref 3.87–5.11)
RDW: 13 % (ref 11.5–15.5)
WBC: 9.4 10*3/uL (ref 4.0–10.5)
nRBC: 0 % (ref 0.0–0.2)

## 2021-03-19 LAB — COMPREHENSIVE METABOLIC PANEL
ALT: 15 U/L (ref 0–44)
AST: 22 U/L (ref 15–41)
Albumin: 4.1 g/dL (ref 3.5–5.0)
Alkaline Phosphatase: 76 U/L (ref 38–126)
Anion gap: 8 (ref 5–15)
BUN: 23 mg/dL (ref 8–23)
CO2: 25 mmol/L (ref 22–32)
Calcium: 9 mg/dL (ref 8.9–10.3)
Chloride: 98 mmol/L (ref 98–111)
Creatinine, Ser: 1.31 mg/dL — ABNORMAL HIGH (ref 0.44–1.00)
GFR, Estimated: 40 mL/min — ABNORMAL LOW (ref >60)
Glucose, Bld: 105 mg/dL — ABNORMAL HIGH (ref 70–99)
Potassium: 4 mmol/L (ref 3.5–5.1)
Sodium: 131 mmol/L — ABNORMAL LOW (ref 135–145)
Total Bilirubin: 0.9 mg/dL (ref 0.3–1.2)
Total Protein: 6.7 g/dL (ref 6.5–8.1)

## 2021-03-19 MED ORDER — HYDROCODONE-ACETAMINOPHEN 5-325 MG PO TABS
1.0000 | ORAL_TABLET | Freq: Once | ORAL | Status: AC
Start: 2021-03-19 — End: 2021-03-19
  Administered 2021-03-19: 1 via ORAL
  Filled 2021-03-19: qty 1

## 2021-03-19 MED ORDER — ONDANSETRON 4 MG PO TBDP
4.0000 mg | ORAL_TABLET | Freq: Once | ORAL | Status: AC
  Administered 2021-03-19: 4 mg via ORAL
  Filled 2021-03-19: qty 1

## 2021-03-19 NOTE — ED Provider Notes (Signed)
[facility]-EMERGENCY DEPARTMENT  ____________________________________________  Time seen: Approximately 5:14 PM  I have reviewed the triage vital signs and the nursing notes.   HISTORY  Chief Complaint Fall   Historian Patient     HPI Traci [name] is a 87 y.o. female with a history of hypertension, hyperlipidemia and hip replacement, presents to the emergency department after she had a mechanical fall outside of her primary care's office.  States that she hit her head and is complaining of right wrist pain, right hip pain and has skin tears of the right hand, right forearm and right lower leg.  She denies chest pain, chest tightness or shortness of breath.  No nausea, vomiting or abdominal pain.  Her tetanus status is up-to-date.  No other alleviating measures have been attempted.   Past Medical History:  Diagnosis Date   Acid reflux    Anxiety    Arthritis    Bruises easily    Cancer (HCC) skin   Squamous cell skin cancer   Depression    Headache    H/O YEARS AGO   Hyperlipidemia    Hypertension    Lumbar pain      Immunizations up to date:  Yes.     Past Medical History:  Diagnosis Date   Acid reflux    Anxiety    Arthritis    Bruises easily    Cancer (HCC) skin   Squamous cell skin cancer   Depression    Headache    H/O YEARS AGO   Hyperlipidemia    Hypertension    Lumbar pain     Patient Active Problem List   Diagnosis Date Noted   Thoracic aortic aneurysm without rupture ([place]) 06/28/2020   Pelvic pain in female 06/27/2020   Premature ventricular contractions 12/31/2019   Nonthrombocytopenic purpura ([place]) 07/27/2019   Varicose veins with pain 09/23/2018   Benign essential hypertension 02/13/2017   Female cystocele 04/16/2016   Medicare annual wellness visit, initial 04/10/2016   Chronic gastritis 04/19/2015   RLS (restless legs syndrome) 09/15/2014   OA (osteoarthritis) 01/11/2014   Lumbar stenosis with neurogenic claudication 12/30/2013     Past Surgical History:  Procedure Laterality Date   BLADDER SURGERY     Prolapse bladder, bladder sling surgery   CATARACT EXTRACTION     CHOLECYSTECTOMY     HIP ARTHROPLASTY     JOINT REPLACEMENT Bilateral    HIP REPLACMENT/ KNEE REPLACEMENT   OPEN REDUCTION INTERNAL FIXATION (ORIF) DISTAL RADIAL FRACTURE Right 10/07/2017   Procedure: OPEN REDUCTION INTERNAL FIXATION (ORIF) DISTAL RADIAL FRACTURE;  Surgeon: Lovell Sheehan, MD;  Location: [facility] ORS;  Service: Orthopedics;  Laterality: Right;    Prior to Admission medications   Medication Sig Start Date End Date Taking? Authorizing Provider  acetaminophen (TYLENOL) 500 MG tablet Take 500 mg by mouth every 4 (four) hours.     [provider]  ALPRAZolam Duanne Moron) 0.25 MG tablet TAKE ONE TABLET BY MOUTH DAILY-AM 08/09/16   [provider]  amLODipine (NORVASC) 10 MG tablet TAKE 0.5 TABLET EACH DAY-AM 02/12/16   [provider]  Ascorbic Acid (VITAMIN C) 1000 MG tablet Take 1,000 mg by mouth daily.     [provider]  Cholecalciferol (VITAMIN D) 2000 units tablet Take 2,000 Units by mouth daily.     [provider]  conjugated estrogens (PREMARIN) vaginal cream Place 1 Applicatorful vaginally 3 (three) times a week.     [provider]  furosemide (LASIX) 20 MG tablet  40 mg daily.  09/23/16   [provider]  ibuprofen (ADVIL,MOTRIN) 600 MG tablet Take 1 tablet (600 mg total) by mouth every 8 (eight) hours as needed. 10/02/17   Lisa Roca, MD  mirabegron ER (MYRBETRIQ) 50 MG TB24 tablet Take 50 mg by mouth 3 (three) times a week.  09/23/16   [provider]  pantoprazole (PROTONIX) 40 MG tablet TAKE ONE TABLET BY MOUTH DAILY-AM 03/11/17   [provider]  pramipexole (MIRAPEX) 0.5 MG tablet TAKE ONE TABLET BY MOUTH EVERY NIGHT AT BEDTIME, MAY TAKE EXTRA 1/2 TABLET IF NEEDED. 04/25/16   [provider]  telmisartan (MICARDIS) 80 MG tablet Take 80 mg by mouth  every morning.  03/17/17   [provider]  traMADol Veatrice Bourbon) 50 MG tablet  06/17/20   [provider]  triamcinolone ointment (KENALOG) 0.5 % Apply 1 application topically daily. 07/25/17   [provider]  UNABLE TO FIND 1 Dose daily. Med Name: "SUPER CREAM"  [place] 1/2 TUBE OF DESITIN AND 1/2 TUBE NYSTATIN WITH AN ENTIRE TUB OF AQUAPHOR AND APPLY TO [place]    [provider]  vitamin B-12 (CYANOCOBALAMIN) 1000 MCG tablet Take 1,000 mcg by mouth daily.     [provider]    Allergies Adhesive [tape]  Family History  Problem Relation Age of Onset   Kidney disease Brother    AAA (abdominal aortic aneurysm) Neg Hx     Social History Social History   Tobacco Use   Smoking status: Never   Smokeless tobacco: Never  Vaping Use   Vaping Use: Never used  Substance Use Topics   Alcohol use: No   Drug use: No     Review of Systems  Constitutional: No fever/chills Eyes:  No discharge ENT: No upper respiratory complaints. Respiratory: no cough. No SOB/ use of accessory muscles to breath Gastrointestinal:   No nausea, no vomiting.  No diarrhea.  No constipation. Musculoskeletal: Negative for musculoskeletal pain. Skin: Patient has skin tears of right hand, right forearm and right upper leg.     ____________________________________________   PHYSICAL EXAM:  VITAL SIGNS: ED Triage Vitals  Enc Vitals Group     BP 03/19/21 1544 (!) 141/77     Pulse Rate 03/19/21 1544 78     Resp 03/19/21 1544 16     Temp 03/19/21 1544 98.1 F (36.7 C)     Temp Source 03/19/21 1544 Oral     SpO2 03/19/21 1544 97 %     Weight 03/19/21 1539 160 lb (72.6 kg)     Height 03/19/21 1539 '5\' 4"'$  (1.626 m)     Head Circumference --      Peak Flow --      Pain Score 03/19/21 1538 7     Pain Loc --      Pain Edu? --      Excl. in [place]? --      Constitutional: Alert and oriented. Well appearing and in no acute distress. Eyes: Conjunctivae are normal.  PERRL. EOMI. Head: Atraumatic. ENT:      Ears: Tms are pearly.      Nose: No congestion/rhinnorhea.      Mouth/Throat: Mucous membranes are moist.  Neck: No stridor.  No cervical spine tenderness to palpation. Cardiovascular: Normal rate, regular rhythm. Normal S1 and S2.  Good peripheral circulation. Respiratory: Normal respiratory effort without tachypnea or retractions. Lungs CTAB. Good air entry to the bases with no decreased or absent breath sounds Gastrointestinal: Bowel  sounds x 4 quadrants. Soft and nontender to palpation. No guarding or rigidity. No distention. Musculoskeletal: Full range of motion to all extremities. No obvious deformities noted Neurologic:  Normal for age. No gross focal neurologic deficits are appreciated.  Skin: Patient has several small skin tears along the right forearm and right hand and a large, 6 cm in length skin tear of the right upper leg. Psychiatric: Mood and affect are normal for age. Speech and behavior are normal.   ____________________________________________   LABS (all labs ordered are listed, but only abnormal results are displayed)  Labs Reviewed  CBC WITH DIFFERENTIAL/PLATELET  COMPREHENSIVE METABOLIC PANEL   ____________________________________________  EKG   ____________________________________________  RADIOLOGY Unk Pinto, personally viewed and evaluated these images (plain radiographs) as part of my medical decision making, as well as reviewing the written report by the radiologist.  DG Chest 1 View  Result Date: 03/19/2021 CLINICAL DATA:  Recent fall EXAM: CHEST  1 VIEW COMPARISON:  None. FINDINGS: Cardiac shadow is within normal limits. The lungs are well aerated bilaterally. No focal infiltrate or sizable effusion is seen. No bony abnormality is noted. IMPRESSION: No acute abnormality seen. Electronically Signed   By: Inez Catalina M.D.   On: 03/19/2021 16:40   CT HEAD WO CONTRAST (5MM)  Result Date:  03/19/2021 CLINICAL DATA:  Fall, hit head on concrete, hematoma to right side of head EXAM: CT HEAD WITHOUT CONTRAST CT MAXILLOFACIAL WITHOUT CONTRAST CT CERVICAL SPINE WITHOUT CONTRAST TECHNIQUE: Multidetector CT imaging of the head, cervical spine, and maxillofacial structures were performed using the standard protocol without intravenous contrast. Multiplanar CT image reconstructions of the cervical spine and maxillofacial structures were also generated. COMPARISON:  None. FINDINGS: CT HEAD FINDINGS Brain: No evidence of acute infarction, hemorrhage, hydrocephalus, extra-axial collection or mass lesion/mass effect. Mild periventricular white matter hypodensity. Vascular: No hyperdense vessel or unexpected calcification. CT FACIAL BONES FINDINGS Skull: Normal. Negative for fracture or focal lesion. Facial bones: No displaced fractures or dislocations. Sinuses/Orbits: No acute finding. Other: Soft tissue contusion of the right forehead (series 2, image 14). CT CERVICAL SPINE FINDINGS Alignment: Degenerative straightening and reversal of the normal cervical lordosis. Skull base and vertebrae: No acute fracture. No primary bone lesion or focal pathologic process. Soft tissues and spinal canal: No prevertebral fluid or swelling. No visible canal hematoma. Disc levels: Moderate to severe disc degenerative disease and osteophytosis of the lower cervical spine from C4 through C7. Upper chest: Negative. Other: None. IMPRESSION: 1. No acute intracranial pathology. Small-vessel white matter disease. 2. No displaced fracture or dislocation of the facial bones. 3. Soft tissue contusion of the right forehead. 4. No fracture or static subluxation of the cervical spine. 5. Moderate to severe disc degenerative disease and osteophytosis of the lower cervical spine from C4 through C7. Electronically Signed   By: Eddie Candle M.D.   On: 03/19/2021 16:36   CT Cervical Spine Wo Contrast  Result Date: 03/19/2021 CLINICAL DATA:   Fall, hit head on concrete, hematoma to right side of head EXAM: CT HEAD WITHOUT CONTRAST CT MAXILLOFACIAL WITHOUT CONTRAST CT CERVICAL SPINE WITHOUT CONTRAST TECHNIQUE: Multidetector CT imaging of the head, cervical spine, and maxillofacial structures were performed using the standard protocol without intravenous contrast. Multiplanar CT image reconstructions of the cervical spine and maxillofacial structures were also generated. COMPARISON:  None. FINDINGS: CT HEAD FINDINGS Brain: No evidence of acute infarction, hemorrhage, hydrocephalus, extra-axial collection or mass lesion/mass effect. Mild periventricular white matter hypodensity. Vascular: No hyperdense vessel  or unexpected calcification. CT FACIAL BONES FINDINGS Skull: Normal. Negative for fracture or focal lesion. Facial bones: No displaced fractures or dislocations. Sinuses/Orbits: No acute finding. Other: Soft tissue contusion of the right forehead (series 2, image 14). CT CERVICAL SPINE FINDINGS Alignment: Degenerative straightening and reversal of the normal cervical lordosis. Skull base and vertebrae: No acute fracture. No primary bone lesion or focal pathologic process. Soft tissues and spinal canal: No prevertebral fluid or swelling. No visible canal hematoma. Disc levels: Moderate to severe disc degenerative disease and osteophytosis of the lower cervical spine from C4 through C7. Upper chest: Negative. Other: None. IMPRESSION: 1. No acute intracranial pathology. Small-vessel white matter disease. 2. No displaced fracture or dislocation of the facial bones. 3. Soft tissue contusion of the right forehead. 4. No fracture or static subluxation of the cervical spine. 5. Moderate to severe disc degenerative disease and osteophytosis of the lower cervical spine from C4 through C7. Electronically Signed   By: Eddie Candle M.D.   On: 03/19/2021 16:36   DG Hip Unilat  With Pelvis 2-3 Views Right  Result Date: 03/19/2021 CLINICAL DATA:  Right hip pain  after fall. EXAM: DG HIP (WITH OR WITHOUT PELVIS) 2-3V RIGHT COMPARISON:  July 04, 2014. FINDINGS: Status post bilateral total hip arthroplasties. Fracture or dislocation is noted. IMPRESSION: No acute abnormality is noted. Electronically Signed   By: Marijo Conception M.D.   On: 03/19/2021 16:27   CT Maxillofacial Wo Contrast  Result Date: 03/19/2021 CLINICAL DATA:  Fall, hit head on concrete, hematoma to right side of head EXAM: CT HEAD WITHOUT CONTRAST CT MAXILLOFACIAL WITHOUT CONTRAST CT CERVICAL SPINE WITHOUT CONTRAST TECHNIQUE: Multidetector CT imaging of the head, cervical spine, and maxillofacial structures were performed using the standard protocol without intravenous contrast. Multiplanar CT image reconstructions of the cervical spine and maxillofacial structures were also generated. COMPARISON:  None. FINDINGS: CT HEAD FINDINGS Brain: No evidence of acute infarction, hemorrhage, hydrocephalus, extra-axial collection or mass lesion/mass effect. Mild periventricular white matter hypodensity. Vascular: No hyperdense vessel or unexpected calcification. CT FACIAL BONES FINDINGS Skull: Normal. Negative for fracture or focal lesion. Facial bones: No displaced fractures or dislocations. Sinuses/Orbits: No acute finding. Other: Soft tissue contusion of the right forehead (series 2, image 14). CT CERVICAL SPINE FINDINGS Alignment: Degenerative straightening and reversal of the normal cervical lordosis. Skull base and vertebrae: No acute fracture. No primary bone lesion or focal pathologic process. Soft tissues and spinal canal: No prevertebral fluid or swelling. No visible canal hematoma. Disc levels: Moderate to severe disc degenerative disease and osteophytosis of the lower cervical spine from C4 through C7. Upper chest: Negative. Other: None. IMPRESSION: 1. No acute intracranial pathology. Small-vessel white matter disease. 2. No displaced fracture or dislocation of the facial bones. 3. Soft tissue  contusion of the right forehead. 4. No fracture or static subluxation of the cervical spine. 5. Moderate to severe disc degenerative disease and osteophytosis of the lower cervical spine from C4 through C7. Electronically Signed   By: Eddie Candle M.D.   On: 03/19/2021 16:36    ____________________________________________    PROCEDURES  Procedure(s) performed:     Marland KitchenMarland KitchenLaceration Repair  Date/Time: 03/19/2021 5:18 PM Performed by: Lannie Fields, PA-C Authorized by: Lannie Fields, PA-C   Consent:    Consent obtained:  Verbal   Risks discussed:  Infection and pain Universal protocol:    Procedure explained and questions answered to patient or proxy's satisfaction: yes     Patient identity confirmed:  Verbally with patient Anesthesia:    Anesthesia method:  None Laceration details:    Location:  Shoulder/arm   Shoulder/arm location:  R lower arm   Length (cm):  4   Depth (mm):  1 Treatment:    Area cleansed with:  Saline   Amount of cleaning:  Standard   Debridement:  None Skin repair:    Repair method:  Steri-Strips and tissue adhesive Repair type:    Repair type:  Simple Post-procedure details:    Dressing:  Non-adherent dressing   Procedure completion:  Tolerated well, no immediate complications .Marland KitchenLaceration Repair  Date/Time: 03/19/2021 5:19 PM Performed by: Lannie Fields, PA-C Authorized by: Lannie Fields, PA-C   Consent:    Consent obtained:  Verbal   Risks discussed:  Infection and pain Universal protocol:    Procedure explained and questions answered to patient or proxy's satisfaction: yes     Patient identity confirmed:  Verbally with patient Laceration details:    Location:  Shoulder/arm   Shoulder/arm location:  R lower arm   Length (cm):  2   Depth (mm):  1 Pre-procedure details:    Preparation:  Patient was prepped and draped in usual sterile fashion Exploration:    Contaminated: no   Treatment:    Area cleansed with:  Saline   Amount of  cleaning:  Standard   Debridement:  None Skin repair:    Repair method:  Steri-Strips Approximation:    Approximation:  Close Post-procedure details:    Dressing:  Non-adherent dressing .Marland KitchenLaceration Repair  Date/Time: 03/19/2021 5:20 PM Performed by: Lannie Fields, PA-C Authorized by: Lannie Fields, PA-C   Consent:    Consent obtained:  Verbal   Risks discussed:  Infection and pain Universal protocol:    Procedure explained and questions answered to patient or proxy's satisfaction: yes     Patient identity confirmed:  Verbally with patient Laceration details:    Location:  Leg   Leg location:  R upper leg   Length (cm):  6   Depth (mm):  1 Exploration:    Limited defect created (wound extended): no     Wound exploration: wound explored through full range of motion   Treatment:    Area cleansed with:  Saline   Debridement:  None Skin repair:    Repair method:  Steri-Strips and tissue adhesive Repair type:    Repair type:  Simple Post-procedure details:    Dressing:  Bulky dressing     Medications - No data to display   ____________________________________________   INITIAL IMPRESSION / ASSESSMENT AND PLAN / ED COURSE  Pertinent labs & imaging results that were available during my care of the patient were reviewed by me and considered in my medical decision making (see chart for details).  Clinical Course as of 03/19/21 1843  Mon Mar 19, 2021  1630 CT HEAD WO CONTRAST (5MM) [JW]    Clinical Course User Index [JW] Lannie Fields, PA-C      Assessment and plan Fall 87 year old female presents to the emergency department after a mechanical, nonsyncopal fall.  Vital signs are reassuring at triage.  On physical exam, patient was alert, active and nontoxic-appearing.  Not  No evidence of intracranial bleed, skull fracture or fracture of the cervical spine on dedicated CTs.  No evidence of right hip fracture.  No evidence of pneumothorax or rib fracture on  chest x-ray.  Patient's wounds were irrigated and skin tears were repaired using Steri-Strips and Dermabond  in the emergency department.  Patient was given Norco in the emergency department for pain    ____________________________________________  FINAL CLINICAL IMPRESSION(S) / ED DIAGNOSES  Final diagnoses:  Hip pain      NEW MEDICATIONS STARTED DURING THIS VISIT:  ED Discharge Orders     None           This chart was dictated using voice recognition software/Dragon. Despite best efforts to proofread, errors can occur which can change the meaning. Any change was purely unintentional.     Lannie Fields, PA-C 03/19/21 1843    Vladimir Crofts, MD 03/19/21 2318

## 2021-03-19 NOTE — ED Notes (Signed)
See triage note  Presents s/p fall  States she was leaving her PCP's office  Fell hitting the cement Laceration noted to LLE  Hematoma to forehead   No LOC

## 2021-03-19 NOTE — ED Triage Notes (Addendum)
Pt had a fall leaving PCP office. Pt head hit the concrete. Pt denies LOC. Fall witnessed by daughter. Pt A&Ox4. Pt with hematoma to right side of head and laceration to RLE. Pt c/o sided pain of hip pain, leg and wrist. Pt with hx hip and knee replacement. Pt right leg slightly shortened

## 2021-03-28 ENCOUNTER — Other Ambulatory Visit: Payer: Self-pay | Admitting: Internal Medicine

## 2021-03-28 ENCOUNTER — Ambulatory Visit
Admission: RE | Admit: 2021-03-28 | Discharge: 2021-03-28 | Disposition: A | Discharge status: Home / Self Care | Payer: Medicare PPO | Source: Ambulatory Visit | Attending: Internal Medicine | Admitting: Internal Medicine

## 2021-03-28 ENCOUNTER — Other Ambulatory Visit: Payer: Self-pay

## 2021-03-28 DIAGNOSIS — R600 Localized edema: Secondary | ICD-10-CM | POA: Insufficient documentation

## 2021-07-18 ENCOUNTER — Ambulatory Visit (INDEPENDENT_AMBULATORY_CARE_PROVIDER_SITE_OTHER): Payer: Medicare PPO | Admitting: Nurse Practitioner

## 2022-10-21 ENCOUNTER — Other Ambulatory Visit: Payer: Self-pay | Admitting: Family Medicine

## 2022-10-21 DIAGNOSIS — M4807 Spinal stenosis, lumbosacral region: Secondary | ICD-10-CM

## 2022-10-28 ENCOUNTER — Ambulatory Visit
Admission: RE | Admit: 2022-10-28 | Discharge: 2022-10-28 | Disposition: A | Discharge status: Home / Self Care | Payer: Medicare PPO | Source: Ambulatory Visit | Attending: Family Medicine | Admitting: Family Medicine

## 2022-10-28 DIAGNOSIS — M4807 Spinal stenosis, lumbosacral region: Secondary | ICD-10-CM | POA: Insufficient documentation

## 2023-02-19 ENCOUNTER — Ambulatory Visit: Payer: Medicare PPO | Admitting: Urology

## 2023-02-19 VITALS — BP 124/70 | HR 90 | Ht 64.5 in | Wt 162.5 lb

## 2023-02-19 DIAGNOSIS — R8290 Unspecified abnormal findings in urine: Secondary | ICD-10-CM | POA: Diagnosis not present

## 2023-02-19 DIAGNOSIS — R350 Frequency of micturition: Secondary | ICD-10-CM | POA: Diagnosis not present

## 2023-02-19 DIAGNOSIS — N3944 Nocturnal enuresis: Secondary | ICD-10-CM

## 2023-02-19 DIAGNOSIS — N898 Other specified noninflammatory disorders of vagina: Secondary | ICD-10-CM

## 2023-02-19 LAB — MICROSCOPIC EXAMINATION: WBC, UA: >30 /hpf — AB (ref 0–5)

## 2023-02-19 LAB — URINALYSIS, COMPLETE
Bilirubin, UA: NEGATIVE
Glucose, UA: NEGATIVE
Ketones, UA: NEGATIVE
Nitrite, UA: NEGATIVE
Protein,UA: NEGATIVE
Specific Gravity, UA: <1.005 — ABNORMAL LOW (ref 1.005–1.030)
Urobilinogen, Ur: 0.2 mg/dL (ref 0.2–1.0)
pH, UA: 5.5 (ref 5.0–7.5)

## 2023-02-19 MED ORDER — GEMTESA 75 MG PO TABS
75.0000 mg | ORAL_TABLET | Freq: Every day | ORAL | 0 refills | Status: DC
Start: 2023-02-19 — End: 2023-04-08

## 2023-02-19 NOTE — Progress Notes (Signed)
Marcelle Overlie Plume,acting as a scribe for Vanna Scotland, MD.,have documented all relevant documentation on the behalf of Vanna Scotland, MD,as directed by  Vanna Scotland, MD while in the presence of Vanna Scotland, MD.  02/19/2023 12:23 PM   [name] 10/20/1932 [id number]  Referring provider: Danella Penton, MD [phone] [facility] [address] [facility] [place],  [place] [address]  Chief Complaint  Patient presents with   Establish Care   Urinary Frequency    HPI: 89 year-old female who presents today with some urinary issues and vaginal complaints. Notably, she was seen by me back in 2018-2019 with the same.   At that point, she ad been previously followed by Urogynecology who is also managing all of these same issues. She underwent hysteropexy, anterior colpography, mid-urethral sling and cysto back in 2017 at [facility]. This corrected her stress incontinence and prolapse. Vaginal exam by me around that same time showed good support. She had urinary urgency for which she was taking myrbetriq. She has had urodynamics and cystoscopy. She was placed on suppressive Hiprex by Urogynecology at [facility] and has also been managed by Dr. Dalbert Garnet. They showed evidence of recurrent bacterial vaginitis and was placed on suppressive metronidazole gel.   Her urinalysis today shows >30 WBC, but otherwise is unremarkable.   Today, she returns with similar complaints. It appears that she has not seen Urogynecology or gynecology for any of these issues in the recent past. She was last seen by Dr. Dalbert Garnet in 2019 for her vulvar itching and vaginal symptoms. She is still on myrbetriq 50 mg chronically. She is now also on Lasix 20 mg daily. She was not referred here. She self-scheduled this appointment. No documented recent urinary tract infections.   She reports today that she has urinary incontinence.  When she stands at nighttime to go to the bathroom, she will have incontinence when her feet  hit the floor and started to get there on time.  This happens twice nightly.  She does not have any daytime incontinence.  This is stable for many years but is somewhat bothersome to her.  She does mention that she only been taking Myrbetriq 1 tablet every other day.  Just over the past week or so, she has opted to daily and cannot tell a difference yet.  She also endorses chronic vaginal irritation similar to what she has had before.    Results for orders placed or performed in visit on 02/19/23  Microscopic Examination   Urine  Result Value Ref Range   WBC, UA >30 (A) 0 - 5 /hpf   RBC, Urine 0-2 0 - 2 /hpf   Epithelial Cells (non renal) 0-10 0 - 10 /hpf   Bacteria, UA Moderate (A) None seen/Few  Urinalysis, Complete  Result Value Ref Range   Specific Gravity, UA <1.005 (L) 1.005 - 1.030   pH, UA 5.5 5.0 - 7.5   Color, UA Yellow Yellow   Appearance Ur Cloudy (A) Clear   Leukocytes,UA 2+ (A) Negative   Protein,UA Negative Negative/Trace   Glucose, UA Negative Negative   Ketones, UA Negative Negative   RBC, UA Trace (A) Negative   Bilirubin, UA Negative Negative   Urobilinogen, Ur 0.2 0.2 - 1.0 mg/dL   Nitrite, UA Negative Negative   Microscopic Examination See below:       PMH: Past Medical History:  Diagnosis Date   Acid reflux    Anxiety    Arthritis    Bruises easily  Cancer (HCC) skin   Squamous cell skin cancer   Depression    Headache    H/O YEARS AGO   Hyperlipidemia    Hypertension    Lumbar pain     Surgical History: Past Surgical History:  Procedure Laterality Date   BLADDER SURGERY     Prolapse bladder, bladder sling surgery   CATARACT EXTRACTION     CHOLECYSTECTOMY     HIP ARTHROPLASTY     JOINT REPLACEMENT Bilateral    HIP REPLACMENT/ KNEE REPLACEMENT   OPEN REDUCTION INTERNAL FIXATION (ORIF) DISTAL RADIAL FRACTURE Right 10/07/2017   Procedure: OPEN REDUCTION INTERNAL FIXATION (ORIF) DISTAL RADIAL FRACTURE;  Surgeon: Lyndle Herrlich, MD;   Location: [facility] ORS;  Service: Orthopedics;  Laterality: Right;    Home Medications:  Allergies as of 02/19/2023       Reactions   Adhesive [tape]    PT HAS VERY SENSITIVE SKIN AND WILL BECOME IRRITATED        Medication List        Accurate as of February 19, 2023 12:23 PM. If you have any questions, ask your nurse or doctor.          STOP taking these medications    conjugated estrogens 0.625 MG/GM vaginal cream Commonly known as: PREMARIN   ibuprofen 600 MG tablet Commonly known as: ADVIL   triamcinolone ointment 0.5 % Commonly known as: KENALOG       TAKE these medications    acetaminophen 500 MG tablet Commonly known as: TYLENOL Take 500 mg by mouth every 4 (four) hours.   ALPRAZolam 0.25 MG tablet Commonly known as: XANAX TAKE ONE TABLET BY MOUTH DAILY-AM   amLODipine 10 MG tablet Commonly known as: NORVASC TAKE 0.5 TABLET EACH DAY-AM   cyanocobalamin 1000 MCG tablet Commonly known as: VITAMIN B12 Take 1,000 mcg by mouth daily.   furosemide 20 MG tablet Commonly known as: LASIX 40 mg daily.   Gemtesa 75 MG Tabs Generic drug: Vibegron Take 1 tablet (75 mg total) by mouth daily.   mirabegron ER 50 MG Tb24 tablet Commonly known as: MYRBETRIQ Take 50 mg by mouth daily.   pantoprazole 40 MG tablet Commonly known as: PROTONIX TAKE ONE TABLET BY MOUTH DAILY-AM   pramipexole 0.5 MG tablet Commonly known as: MIRAPEX TAKE ONE TABLET BY MOUTH EVERY NIGHT AT BEDTIME, MAY TAKE EXTRA 1/2 TABLET IF NEEDED.   telmisartan 80 MG tablet Commonly known as: MICARDIS Take 80 mg by mouth every morning.   traMADol 50 MG tablet Commonly known as: ULTRAM   UNABLE TO FIND 1 Dose daily. Med Name: "SUPER CREAM"  MIX 1/2 TUBE OF DESITIN AND 1/2 TUBE NYSTATIN WITH AN ENTIRE TUB OF AQUAPHOR AND APPLY TO VULVAR AREA   vitamin C 1000 MG tablet Take 1,000 mg by mouth daily.   Vitamin D 50 MCG (2000 UT) tablet Take 2,000 Units by mouth daily.         Allergies:  Allergies  Allergen Reactions   Adhesive [Tape]     PT HAS VERY SENSITIVE SKIN AND WILL BECOME IRRITATED    Family History: Family History  Problem Relation Age of Onset   Kidney disease Brother    AAA (abdominal aortic aneurysm) Neg Hx     Social History:  reports that she has never smoked. She has never used smokeless tobacco. She reports that she does not drink alcohol and does not use drugs.   Physical Exam: BP 124/70   Pulse 90  Ht 5' 4.5" (1.638 m)   Wt 162 lb 8 oz (73.7 kg)   BMI 27.46 kg/m   Constitutional:  Alert and oriented, No acute distress. HEENT: [place] AT, moist mucus membranes.  Trachea midline, no masses. Neurologic: Grossly intact, no focal deficits, moving all 4 extremities. Psychiatric: Normal mood and affect.   Assessment & Plan:    1. Nocturnal urinary incontinence - Continue myrbetriq 50 mg daily for the next 2-3 weeks to assess for improvement (was likely in a suboptimal dose previously) - Provide samples of gemtesa 75 mh to try if no improvement with myrbetriq -Given her advanced age, would strongly favor avoiding anticholinergics - Discuss potential insurance coverage for gemtesa  2. Vaginal irritation - Recommend following up with Dr. Dalbert Garnet for management of vulvar irritation and itching - She has had issues chronically with this in the past - Previously managed with boric acid and metronidazole  3. Abnormal urinalysis - She does have some pyuria but does not have UTI symptoms. - We would not recommend treatment as she has had chronic colonization in the past and suspect this.   Return in about 2 months (around 04/21/2023) for assessment of medication efficacy.  I have seen and examined the patient, labs/ imaging reviewed and discussed  management with Carman Ching. I reviewed the PA's note and agree with the documented findings and plan of care.    [facility] [address], [address] [place], [place] [address] [phone]

## 2023-04-08 ENCOUNTER — Telehealth: Payer: Self-pay | Admitting: Urology

## 2023-04-08 DIAGNOSIS — R350 Frequency of micturition: Secondary | ICD-10-CM

## 2023-04-08 DIAGNOSIS — N898 Other specified noninflammatory disorders of vagina: Secondary | ICD-10-CM

## 2023-04-08 DIAGNOSIS — R8290 Unspecified abnormal findings in urine: Secondary | ICD-10-CM

## 2023-04-08 MED ORDER — GEMTESA 75 MG PO TABS: 75.0000 mg | ORAL_TABLET | Freq: Every day | ORAL | 1 refills | Status: DC

## 2023-04-08 NOTE — Telephone Encounter (Signed)
Pt would like a refill for [facility] sent to [facility]

## 2023-04-08 NOTE — Telephone Encounter (Signed)
Medication sent in, patient advised, also advised to keep follow up appointment in October

## 2023-04-11 ENCOUNTER — Ambulatory Visit: Payer: Medicare PPO | Admitting: Urology

## 2023-04-25 ENCOUNTER — Ambulatory Visit: Payer: Medicare PPO | Admitting: Urology

## 2023-05-06 ENCOUNTER — Ambulatory Visit: Payer: Medicare PPO | Admitting: Urology

## 2023-06-10 ENCOUNTER — Other Ambulatory Visit: Payer: Self-pay | Admitting: Urology

## 2023-06-10 DIAGNOSIS — R350 Frequency of micturition: Secondary | ICD-10-CM

## 2023-06-10 DIAGNOSIS — R8290 Unspecified abnormal findings in urine: Secondary | ICD-10-CM

## 2023-06-10 DIAGNOSIS — N898 Other specified noninflammatory disorders of vagina: Secondary | ICD-10-CM

## 2023-07-04 ENCOUNTER — Ambulatory Visit: Payer: Medicare PPO | Admitting: Physician Assistant

## 2023-07-07 NOTE — Progress Notes (Unsigned)
07/09/2023 8:06 AM   [name] 07-23-1932 [id number]  Referring provider: Danella Penton, MD [phone] [facility] [address] [facility] [place],  [place] [address]  Urological history: 1. SUI -contributing factors of age and GSM -hysteropexy, anterior colpography, mid-urethral sling and cysto (2017)   2. rUTI's -contributing factors of age and GSM -documented urine cultures over the last year  Was given Cipro for possible UTI in October, no culture performed -Hiprex 1 gram twice daily ***  3. Nocturnal enuresis -contributing factors of age, GSM, diuretics, hypertension, pedal edema, anxiety, depression, lumbar stenosis with neurogenic claudication -Gemtesa 75 mg daily ***  No chief complaint on file.   HPI: PAYING ARISPE is a 89 y.o. female who presents today for night time incontinence.  Previous records reviewed.   PVR ***  UA ***   PMH: Past Medical History:  Diagnosis Date   Acid reflux    Anxiety    Arthritis    Bruises easily    Cancer (HCC) skin   Squamous cell skin cancer   Depression    Headache    H/O YEARS AGO   Hyperlipidemia    Hypertension    Lumbar pain     Surgical History: Past Surgical History:  Procedure Laterality Date   BLADDER SURGERY     Prolapse bladder, bladder sling surgery   CATARACT EXTRACTION     CHOLECYSTECTOMY     HIP ARTHROPLASTY     JOINT REPLACEMENT Bilateral    HIP REPLACMENT/ KNEE REPLACEMENT   OPEN REDUCTION INTERNAL FIXATION (ORIF) DISTAL RADIAL FRACTURE Right 10/07/2017   Procedure: OPEN REDUCTION INTERNAL FIXATION (ORIF) DISTAL RADIAL FRACTURE;  Surgeon: Lyndle Herrlich, MD;  Location: [facility] ORS;  Service: Orthopedics;  Laterality: Right;    Home Medications:  Allergies as of 07/09/2023       Reactions   Adhesive [tape]    PT HAS VERY SENSITIVE SKIN AND WILL BECOME IRRITATED        Medication List        Accurate as of July 07, 2023  8:06 AM. If you have any  questions, ask your nurse or doctor.          acetaminophen 500 MG tablet Commonly known as: TYLENOL Take 500 mg by mouth every 4 (four) hours.   ALPRAZolam 0.25 MG tablet Commonly known as: XANAX TAKE ONE TABLET BY MOUTH DAILY-AM   amLODipine 10 MG tablet Commonly known as: NORVASC TAKE 0.5 TABLET EACH DAY-AM   cyanocobalamin 1000 MCG tablet Commonly known as: VITAMIN B12 Take 1,000 mcg by mouth daily.   furosemide 20 MG tablet Commonly known as: LASIX 40 mg daily.   Gemtesa 75 MG Tabs Generic drug: Vibegron Take 1 tablet (75 mg total) by mouth daily.   mirabegron ER 50 MG Tb24 tablet Commonly known as: MYRBETRIQ Take 50 mg by mouth daily.   pantoprazole 40 MG tablet Commonly known as: PROTONIX TAKE ONE TABLET BY MOUTH DAILY-AM   pramipexole 0.5 MG tablet Commonly known as: MIRAPEX TAKE ONE TABLET BY MOUTH EVERY NIGHT AT BEDTIME, MAY TAKE EXTRA 1/2 TABLET IF NEEDED.   telmisartan 80 MG tablet Commonly known as: MICARDIS Take 80 mg by mouth every morning.   traMADol 50 MG tablet Commonly known as: ULTRAM   UNABLE TO FIND 1 Dose daily. Med Name: "SUPER CREAM"  MIX 1/2 TUBE OF DESITIN AND 1/2 TUBE NYSTATIN WITH AN ENTIRE TUB OF AQUAPHOR AND APPLY TO VULVAR AREA   vitamin C  1000 MG tablet Take 1,000 mg by mouth daily.   Vitamin D 50 MCG (2000 UT) tablet Take 2,000 Units by mouth daily.        Allergies:  Allergies  Allergen Reactions   Adhesive [Tape]     PT HAS VERY SENSITIVE SKIN AND WILL BECOME IRRITATED    Family History: Family History  Problem Relation Age of Onset   Kidney disease Brother    AAA (abdominal aortic aneurysm) Neg Hx     Social History:  reports that she has never smoked. She has never used smokeless tobacco. She reports that she does not drink alcohol and does not use drugs.  ROS: Pertinent ROS in HPI  Physical Exam: There were no vitals taken for this visit.  Constitutional:  Well nourished. Alert and oriented, No  acute distress. HEENT: [place] AT, moist mucus membranes.  Trachea midline, no masses. Cardiovascular: No clubbing, cyanosis, or edema. Respiratory: Normal respiratory effort, no increased work of breathing. GU: No CVA tenderness.  No bladder fullness or masses.  Recession of labia minora, dry, pale vulvar vaginal mucosa and loss of mucosal ridges and folds.  Normal urethral meatus, no lesions, no prolapse, no discharge.   No urethral masses, tenderness and/or tenderness. No bladder fullness, tenderness or masses. *** vagina mucosa, *** estrogen effect, no discharge, no lesions, *** pelvic support, *** cystocele and *** rectocele noted.  No cervical motion tenderness.  Uterus is freely mobile and non-fixed.  No adnexal/parametria masses or tenderness noted.  Anus and perineum are without rashes or lesions.   ***  Neurologic: Grossly intact, no focal deficits, moving all 4 extremities. Psychiatric: Normal mood and affect.    Laboratory Data: CBC w/auto Differential (5 Part) Order: [id number] Component Ref Range & Units 2 mo ago  WBC (White Blood Cell Count) 4.1 - 10.2 10^3/uL 6.4  RBC (Red Blood Cell Count) 4.04 - 5.48 10^6/uL 4.47  Hemoglobin 12.0 - 15.0 gm/dL 40.9  Hematocrit 81.1 - 47.0 % 42.2  MCV (Mean Corpuscular Volume) 80.0 - 100.0 fl 94.4  MCH (Mean Corpuscular Hemoglobin) 27.0 - 31.2 pg 31.1  MCHC (Mean Corpuscular Hemoglobin Concentration) 32.0 - 36.0 gm/dL 91.4  Platelet Count 782 - 450 10^3/uL 167  RDW-CV (Red Cell Distribution Width) 11.6 - 14.8 % 14.2  MPV (Mean Platelet Volume) 9.4 - 12.4 fl 10.4  Neutrophils 1.50 - 7.80 10^3/uL 3.83  Lymphocytes 1.00 - 3.60 10^3/uL 1.8  Monocytes 0.00 - 1.50 10^3/uL 0.55  Eosinophils 0.00 - 0.55 10^3/uL 0.1  Basophils 0.00 - 0.09 10^3/uL 0.05  Neutrophil % 32.0 - 70.0 % 60.3  Lymphocyte % 10.0 - 50.0 % 28.3  Monocyte % 4.0 - 13.0 % 8.7  Eosinophil % 1.0 - 5.0 % 1.6  Basophil% 0.0 - 2.0 % 0.8  Immature Granulocyte  % <=0.7 % 0.3  Immature Granulocyte Count <=0.06 10^3/L 0.02  Resulting Agency [facility]   Specimen Collected: 05/01/23 08:07   Performed by: [facility] Last Resulted: 05/01/23 08:41  Received From: [facility]  Result Received: 06/10/23 10:23    Comprehensive Metabolic Panel (CMP) Order: [id number] Component Ref Range & Units 2 mo ago  Glucose 70 - 110 mg/dL 578  Sodium 469 - 629 mmol/L 141  Potassium 3.6 - 5.1 mmol/L 4.3  Chloride 97 - 109 mmol/L 104  Carbon Dioxide (CO2) 22.0 - 32.0 mmol/L 30.4  Urea Nitrogen (BUN) 7 - 25 mg/dL 31 High   Creatinine 0.6 - 1.1 mg/dL  1.3 High   Glomerular Filtration Rate (eGFR) >60 mL/min/1.73sq m 40 Low   Comment: CKD-EPI (2021) does not include patient's race in the calculation of eGFR.  Monitoring changes of plasma creatinine and eGFR over time is useful for monitoring kidney function.  Interpretive Ranges for eGFR (CKD-EPI 2021):  eGFR:       >60 mL/min/1.73 sq. m - Normal eGFR:       30-59 mL/min/1.73 sq. m - Moderately Decreased eGFR:       15-29 mL/min/1.73 sq. m  - Severely Decreased eGFR:       < 15 mL/min/1.73 sq. m  - Kidney Failure   Note: These eGFR calculations do not apply in acute situations when eGFR is changing rapidly or patients on dialysis.  Calcium 8.7 - 10.3 mg/dL 9.3  AST 8 - 39 U/L 16  ALT 5 - 38 U/L 12  Alk Phos (alkaline Phosphatase) 34 - 104 U/L 106 High   Albumin 3.5 - 4.8 g/dL 4  Bilirubin, Total 0.3 - 1.2 mg/dL 0.9  Protein, Total 6.1 - 7.9 g/dL 6.4  A/G Ratio 1.0 - 5.0 gm/dL 1.7  Resulting Agency [facility]   Specimen Collected: 05/01/23 08:07   Performed by: [facility] Last Resulted: 05/01/23 16:09  Received From: [facility]  Result Received: 06/10/23 10:23     Urinalysis Urinalysis w/Microscopic Order: [id number] Component Ref Range & Units 2 mo ago  Color Colorless, Straw, Light  Yellow, Yellow, Dark Yellow Light Yellow  Clarity Clear Cloudy Abnormal   Specific Gravity 1.005 - 1.030 1.016  pH, Urine 5.0 - 8.0 6.5  Protein, Urinalysis Negative mg/dL Negative  Glucose, Urinalysis Negative mg/dL Negative  Ketones, Urinalysis Negative mg/dL Negative  Blood, Urinalysis Negative Negative  Nitrite, Urinalysis Negative Negative  Leukocyte Esterase, Urinalysis Negative 3+ Abnormal   Bilirubin, Urinalysis Negative Negative  Urobilinogen, Urinalysis 0.2 - 1.0 mg/dL 0.2  WBC, UA <=5 /hpf 42 High   Red Blood Cells, Urinalysis <=3 /hpf 2  Bacteria, Urinalysis 0 - 5 /hpf 0-5  Squamous Epithelial Cells, Urinalysis /hpf 0  Resulting Agency [facility]   Specimen Collected: 05/01/23 08:07   Performed by: [facility] Last Resulted: 05/01/23 08:43  Received From: [facility]  Result Received: 06/10/23 10:23  I have reviewed the labs.   Pertinent Imaging: ***  Assessment & Plan:  ***  1. Nocturia  - I explained to the patient that nocturia is often multi-factorial and difficult to treat.  Sleeping disorders, heart conditions, peripheral vascular disease, diabetes, an enlarged prostate for men, an urethral stricture causing bladder outlet obstruction and/or certain medications can contribute to nocturia.  - I have suggested that the patient avoid caffeine after noon and alcohol in the evening.  He or she may also benefit from fluid restrictions after 6:00 in the evening and voiding just prior to bedtime.  - I have explained that research studies have showed that over 84% of patients with sleep apnea reported frequent nighttime urination.   With sleep apnea, oxygen decreases, carbon dioxide increases, the blood become more acidic, the heart rate drops and blood vessels in the lung constrict.  The body is then alerted that something is very wrong. The sleeper must wake enough to reopen the airway. By this time, the heart  is racing and experiences a false signal of fluid overload. The heart excretes a hormone-like protein that tells the body to get  rid of sodium and water, resulting in nocturia.  -  I also informed the patient that a recent study noted that decreasing sodium intake to 2.3 grams daily, if they don't have issues with hyponatremia, can also reduce the number of nightly voids  - There is also an increased incidence in sleep apnea with menopause, symptoms include night sweats, daytime sleepiness, depressed mood, and cognitive complaints like poor concentration or problems with short-term memory ***  - The patient may benefit from a discussion with his or her primary care physician to see if he or she has risk factors for sleep apnea or other sleep disturbances and obtaining a sleep study.  Restricting fluids in the evening (especially caffeinated beverages). Taking diuretic medication in the morning or at least six hours before bedtime. Taking afternoon naps. Naps allow your bloodstream to absorb liquid, meaning you'll need to use the bathroom after a nap. This could reduce your trips to the bathroom at night. Elevating your legs while you're sitting at home. This helps with fluid distribution. Pelvic floor physical therapy to strengthen your pelvic floor muscles. Wearing compression stockings, which also helps with fluid distribution.   No follow-ups on file.  These notes generated with voice recognition software. I apologize for typographical errors.  Cloretta Ned  [facility] [address]  [address] [place], [place] [address] [phone]

## 2023-07-09 ENCOUNTER — Encounter: Payer: Self-pay | Admitting: Urology

## 2023-07-09 ENCOUNTER — Ambulatory Visit: Payer: Medicare PPO | Admitting: Urology

## 2023-07-09 VITALS — BP 143/85 | HR 84

## 2023-07-09 DIAGNOSIS — R3989 Other symptoms and signs involving the genitourinary system: Secondary | ICD-10-CM

## 2023-07-09 DIAGNOSIS — R82998 Other abnormal findings in urine: Secondary | ICD-10-CM

## 2023-07-09 DIAGNOSIS — R350 Frequency of micturition: Secondary | ICD-10-CM

## 2023-07-09 DIAGNOSIS — R351 Nocturia: Secondary | ICD-10-CM | POA: Diagnosis not present

## 2023-07-09 DIAGNOSIS — N952 Postmenopausal atrophic vaginitis: Secondary | ICD-10-CM | POA: Diagnosis not present

## 2023-07-09 DIAGNOSIS — N3944 Nocturnal enuresis: Secondary | ICD-10-CM

## 2023-07-09 DIAGNOSIS — N898 Other specified noninflammatory disorders of vagina: Secondary | ICD-10-CM

## 2023-07-09 LAB — URINALYSIS, COMPLETE
Bilirubin, UA: NEGATIVE
Glucose, UA: NEGATIVE
Ketones, UA: NEGATIVE
Nitrite, UA: NEGATIVE
Protein,UA: NEGATIVE
RBC, UA: NEGATIVE
Specific Gravity, UA: 1.01 (ref 1.005–1.030)
Urobilinogen, Ur: 0.2 mg/dL (ref 0.2–1.0)
pH, UA: 5.5 (ref 5.0–7.5)

## 2023-07-09 LAB — MICROSCOPIC EXAMINATION

## 2023-07-09 MED ORDER — CEFUROXIME AXETIL 500 MG PO TABS: 500.0000 mg | ORAL_TABLET | Freq: Two times a day (BID) | ORAL | 0 refills | Status: AC

## 2023-07-09 MED ORDER — GEMTESA 75 MG PO TABS: 75.0000 mg | ORAL_TABLET | Freq: Every day | ORAL | 11 refills | Status: AC

## 2023-07-12 LAB — CULTURE, URINE COMPREHENSIVE

## 2023-07-17 ENCOUNTER — Telehealth: Payer: Self-pay

## 2023-07-17 NOTE — Telephone Encounter (Signed)
Pt aware.

## 2023-07-17 NOTE — Telephone Encounter (Signed)
-----   Message from [facility] sent at 07/12/2023  7:55 PM EST ----- Please let Mrs. Briegel know that her urine culture was positive for infection and that the Ceftin that I prescribed should treat the infection.  We will see her again for follow up on January 23rd.

## 2023-07-18 ENCOUNTER — Other Ambulatory Visit: Payer: Self-pay | Admitting: Internal Medicine

## 2023-07-18 DIAGNOSIS — R1084 Generalized abdominal pain: Secondary | ICD-10-CM

## 2023-07-28 ENCOUNTER — Ambulatory Visit: Admission: RE | Admit: 2023-07-28 | Payer: Medicare PPO | Source: Ambulatory Visit

## 2023-08-11 ENCOUNTER — Ambulatory Visit
Admission: RE | Admit: 2023-08-11 | Discharge: 2023-08-11 | Disposition: A | Discharge status: Home / Self Care | Payer: Medicare PPO | Source: Ambulatory Visit | Attending: Internal Medicine | Admitting: Internal Medicine

## 2023-08-11 DIAGNOSIS — R1084 Generalized abdominal pain: Secondary | ICD-10-CM | POA: Insufficient documentation

## 2023-08-14 ENCOUNTER — Ambulatory Visit: Payer: Medicare PPO | Admitting: Urology

## 2023-08-28 NOTE — Progress Notes (Signed)
 08/29/2023 10:38 PM   Traci [name] 1933-06-10 [id number]  Referring provider: Cleotilde Oneil FALCON, MD [phone] [facility] [address] [facility] [place],  [place] [address]  Urological history: 1. SUI -contributing factors of age and GSM -hysteropexy, anterior colpography, mid-urethral sling and cysto (2017)   2. rUTI's -contributing factors of age and GSM -documented urine cultures over the last year  July 03, 2023, Klebsiella species  Was given Cipro for possible UTI in October, no culture performed  3. Nocturnal enuresis -contributing factors of age, GSM, diuretics, hypertension, pedal edema, anxiety, depression, lumbar stenosis with neurogenic claudication -Gemtesa  75 mg daily   Chief Complaint  Patient presents with   Follow-up   HPI: Traci [name] is a 89 y.o. female who presents today for night time incontinence with her niece.    Previous records reviewed.   At her visit on 07/09/2023, she is having 1-7 daytime urinations, 3 or more episodes of nocturia with strong urge to urinate.  She has urge incontinence.  She leaks 3 more times a day.  She wears 3 absorbent pads daily she does engage in toilet mapping.  She does limit fluid intake.  She states for the last few days she has been having burning with urination and suprapubic pressure.  Patient denies any modifying or aggravating factors.  Patient denies any recent UTI's, gross hematuria, dysuria or suprapubic/flank pain.  Patient denies any fevers, chills, nausea or vomiting.   PVR 70 cc.  UA 11-30 WBCs and many bacteria hyaline cast present.  Urine culture positive for Klebsiella pneumonia.  She has also run out of her prescription of Gemtesa  and has been taking Myrbetriq in its place.  She would like to go back on Gemtesa .  She is treated with culture appropriate antibiotics, her Gemtesa  was refilled and she was advised to return in 1 month.  She did not present for that appointment.  She is  having 1-7 daytime voids, 3 more episodes of nocturia with a strong urge to urinate.  She does says urge incontinence.  She is leaking 3 more times a day she always wears absorbent products for urinary leakage.  She is wearing 3 absorbent pads daily.  She does sometimes limit fluid intake.  She does engage in toilet mapping.   She did not feel that the Gemtesa  helped her nocturia at all.    Patient denies any modifying or aggravating factors.  Patient denies any recent UTI's, gross hematuria, dysuria or suprapubic/flank pain.  Patient denies any fevers, chills, nausea or vomiting.    She feels that her nocturia has improved since the last time she was seen by us , but she has developed worsening incontinence.    Her UA was benign.   PMH: Past Medical History:  Diagnosis Date   Acid reflux    Anxiety    Arthritis    Bruises easily    Cancer (HCC) skin   Squamous cell skin cancer   Depression    Headache    H/O YEARS AGO   Hyperlipidemia    Hypertension    Lumbar pain     Surgical History: Past Surgical History:  Procedure Laterality Date   BLADDER SURGERY     Prolapse bladder, bladder sling surgery   CATARACT EXTRACTION     CHOLECYSTECTOMY     HIP ARTHROPLASTY     JOINT REPLACEMENT Bilateral    HIP REPLACMENT/ KNEE REPLACEMENT   OPEN REDUCTION INTERNAL FIXATION (ORIF) DISTAL RADIAL FRACTURE Right 10/07/2017  Procedure: OPEN REDUCTION INTERNAL FIXATION (ORIF) DISTAL RADIAL FRACTURE;  Surgeon: Traci Lynwood SAUNDERS, MD;  Location: [facility] ORS;  Service: Orthopedics;  Laterality: Right;    Home Medications:  Allergies as of 08/29/2023       Reactions   Adhesive [tape]    PT HAS VERY SENSITIVE SKIN AND WILL BECOME IRRITATED   Hydrocodone  Other (See Comments)   ams        Medication List        Accurate as of August 29, 2023 11:59 PM. If you have any questions, ask your nurse or doctor.          acetaminophen  500 MG tablet Commonly known as: TYLENOL  Take 500 mg by mouth  every 4 (four) hours.   ALPRAZolam 0.25 MG tablet Commonly known as: XANAX TAKE ONE TABLET BY MOUTH DAILY-AM   amLODipine 10 MG tablet Commonly known as: NORVASC TAKE 0.5 TABLET EACH DAY-AM   cefUROXime  500 MG tablet Commonly known as: CEFTIN  Take 1 tablet (500 mg total) by mouth 2 (two) times daily with a meal.   cyanocobalamin 1000 MCG tablet Commonly known as: VITAMIN B12 Take 1,000 mcg by mouth daily.   furosemide 20 MG tablet Commonly known as: LASIX 40 mg daily.   gabapentin  100 MG capsule Commonly known as: NEURONTIN  Take 100 mg by mouth daily.   Gemtesa  75 MG Tabs Generic drug: Vibegron  Take 1 tablet (75 mg total) by mouth daily.   meloxicam 7.5 MG tablet Commonly known as: MOBIC Take 7.5 mg by mouth daily.   metoCLOPramide 5 MG tablet Commonly known as: REGLAN 1 pill in the morning,1 pill at bedtime   metoprolol succinate 25 MG 24 hr tablet Commonly known as: TOPROL-XL Take 25 mg by mouth daily.   pantoprazole 40 MG tablet Commonly known as: PROTONIX TAKE ONE TABLET BY MOUTH DAILY-AM   pramipexole 0.5 MG tablet Commonly known as: MIRAPEX TAKE ONE TABLET BY MOUTH EVERY NIGHT AT BEDTIME, MAY TAKE EXTRA 1/2 TABLET IF NEEDED.   telmisartan 80 MG tablet Commonly known as: MICARDIS Take 80 mg by mouth every morning.   traMADol  50 MG tablet Commonly known as: ULTRAM    UNABLE TO FIND 1 Dose daily. Med Name: SUPER CREAM  MIX 1/2 TUBE OF DESITIN AND 1/2 TUBE NYSTATIN WITH AN ENTIRE TUB OF AQUAPHOR AND APPLY TO VULVAR AREA   vitamin C 1000 MG tablet Take 1,000 mg by mouth daily.   Vitamin D 50 MCG (2000 UT) tablet Take 2,000 Units by mouth daily.        Allergies:  Allergies  Allergen Reactions   Adhesive [Tape]     PT HAS VERY SENSITIVE SKIN AND WILL BECOME IRRITATED   Hydrocodone  Other (See Comments)    ams    Family History: Family History  Problem Relation Age of Onset   Kidney disease Brother    AAA (abdominal aortic aneurysm)  Neg Hx     Social History:  reports that she has never smoked. She has never used smokeless tobacco. She reports that she does not drink alcohol and does not use drugs.  ROS: Pertinent ROS in HPI  Physical Exam: BP 132/81 (BP Location: Other (Comment))   Pulse 96   Constitutional:  Well nourished. Alert and oriented, No acute distress. HEENT: [place] AT, moist mucus membranes.  Trachea midline, no masses. Cardiovascular: No clubbing, cyanosis, or edema. Respiratory: Normal respiratory effort, no increased work of breathing. Neurologic: Grossly intact, no focal deficits, moving all 4 extremities. Psychiatric: Normal  mood and affect.     Laboratory Data: CBC w/auto Differential (5 Part) Order: [id number] Component Ref Range & Units 2 mo ago  WBC (White Blood Cell Count) 4.1 - 10.2 10^3/uL 6.4  RBC (Red Blood Cell Count) 4.04 - 5.48 10^6/uL 4.47  Hemoglobin 12.0 - 15.0 gm/dL 86.0  Hematocrit 64.9 - 47.0 % 42.2  MCV (Mean Corpuscular Volume) 80.0 - 100.0 fl 94.4  MCH (Mean Corpuscular Hemoglobin) 27.0 - 31.2 pg 31.1  MCHC (Mean Corpuscular Hemoglobin Concentration) 32.0 - 36.0 gm/dL 67.0  Platelet Count 849 - 450 10^3/uL 167  RDW-CV (Red Cell Distribution Width) 11.6 - 14.8 % 14.2  MPV (Mean Platelet Volume) 9.4 - 12.4 fl 10.4  Neutrophils 1.50 - 7.80 10^3/uL 3.83  Lymphocytes 1.00 - 3.60 10^3/uL 1.8  Monocytes 0.00 - 1.50 10^3/uL 0.55  Eosinophils 0.00 - 0.55 10^3/uL 0.1  Basophils 0.00 - 0.09 10^3/uL 0.05  Neutrophil % 32.0 - 70.0 % 60.3  Lymphocyte % 10.0 - 50.0 % 28.3  Monocyte % 4.0 - 13.0 % 8.7  Eosinophil % 1.0 - 5.0 % 1.6  Basophil% 0.0 - 2.0 % 0.8  Immature Granulocyte % <=0.7 % 0.3  Immature Granulocyte Count <=0.06 10^3/L 0.02  Resulting Agency [facility]   Specimen Collected: 05/01/23 08:07   Performed by: [facility] Last Resulted: 05/01/23 08:41  Received From: [facility]  Result Received:  06/10/23 10:23    Comprehensive Metabolic Panel (CMP) Order: [id number] Component Ref Range & Units 2 mo ago  Glucose 70 - 110 mg/dL 896  Sodium 863 - 854 mmol/L 141  Potassium 3.6 - 5.1 mmol/L 4.3  Chloride 97 - 109 mmol/L 104  Carbon Dioxide (CO2) 22.0 - 32.0 mmol/L 30.4  Urea Nitrogen (BUN) 7 - 25 mg/dL 31 High   Creatinine 0.6 - 1.1 mg/dL 1.3 High   Glomerular Filtration Rate (eGFR) >60 mL/min/1.73sq m 40 Low   Comment: CKD-EPI (2021) does not include patient's race in the calculation of eGFR.  Monitoring changes of plasma creatinine and eGFR over time is useful for monitoring kidney function.  Interpretive Ranges for eGFR (CKD-EPI 2021):  eGFR:       >60 mL/min/1.73 sq. m - Normal eGFR:       30-59 mL/min/1.73 sq. m - Moderately Decreased eGFR:       15-29 mL/min/1.73 sq. m  - Severely Decreased eGFR:       < 15 mL/min/1.73 sq. m  - Kidney Failure   Note: These eGFR calculations do not apply in acute situations when eGFR is changing rapidly or patients on dialysis.  Calcium 8.7 - 10.3 mg/dL 9.3  AST 8 - 39 U/L 16  ALT 5 - 38 U/L 12  Alk Phos (alkaline Phosphatase) 34 - 104 U/L 106 High   Albumin 3.5 - 4.8 g/dL 4  Bilirubin, Total 0.3 - 1.2 mg/dL 0.9  Protein, Total 6.1 - 7.9 g/dL 6.4  A/G Ratio 1.0 - 5.0 gm/dL 1.7  Resulting Agency [facility]   Specimen Collected: 05/01/23 08:07   Performed by: [facility] Last Resulted: 05/01/23 16:09  Received From: [facility]  Result Received: 06/10/23 10:23     Urinalysis Urinalysis w/Microscopic Order: [id number] Component Ref Range & Units 2 mo ago  Color Colorless, Straw, Light Yellow, Yellow, Dark Yellow Light Yellow  Clarity Clear Cloudy Abnormal   Specific Gravity 1.005 - 1.030 1.016  pH, Urine 5.0 - 8.0  6.5  Protein, Urinalysis Negative mg/dL Negative  Glucose, Urinalysis Negative mg/dL Negative  Ketones, Urinalysis Negative mg/dL Negative   Blood, Urinalysis Negative Negative  Nitrite, Urinalysis Negative Negative  Leukocyte Esterase, Urinalysis Negative 3+ Abnormal   Bilirubin, Urinalysis Negative Negative  Urobilinogen, Urinalysis 0.2 - 1.0 mg/dL 0.2  WBC, UA <=5 /hpf 42 High   Red Blood Cells, Urinalysis <=3 /hpf 2  Bacteria, Urinalysis 0 - 5 /hpf 0-5  Squamous Epithelial Cells, Urinalysis /hpf 0  Resulting Agency [facility]   Specimen Collected: 05/01/23 08:07   Performed by: [facility] Last Resulted: 05/01/23 08:43  Received From: [facility]  Result Received: 06/10/23 10:23   Urinalysis See EPIC and HPI I have reviewed the labs.    Assessment & Plan:    1. Incontinence -UA is benign, but I will send it for culture to rule out indolent infection -we discussed that Myrbetriq was not effective either, and we discussed anticholinergics and their side effects and she did not want to try those medications and we discussed PTNS and she was not interested in this treatment option -she will wait on urine culture results  2. Nocturia - improved  3. Vaginal atrophy -she deferred vaginal estrogen cream because she has been told she has fibrocystic breast disease and to avoid estrogen  Return for pending urine culture results .  These notes generated with voice recognition software. I apologize for typographical errors.  CLOTILDA HELON RIGGERS  [facility] [address]  [address] [place], [place] [address] [phone]

## 2023-08-29 ENCOUNTER — Ambulatory Visit: Payer: Medicare PPO | Admitting: Urology

## 2023-08-29 ENCOUNTER — Telehealth: Payer: Self-pay

## 2023-08-29 VITALS — BP 132/81 | HR 96

## 2023-08-29 DIAGNOSIS — R351 Nocturia: Secondary | ICD-10-CM

## 2023-08-29 DIAGNOSIS — R32 Unspecified urinary incontinence: Secondary | ICD-10-CM | POA: Diagnosis not present

## 2023-08-29 DIAGNOSIS — N952 Postmenopausal atrophic vaginitis: Secondary | ICD-10-CM

## 2023-08-29 DIAGNOSIS — R3989 Other symptoms and signs involving the genitourinary system: Secondary | ICD-10-CM

## 2023-08-29 LAB — URINALYSIS, COMPLETE
Bilirubin, UA: NEGATIVE
Glucose, UA: NEGATIVE
Ketones, UA: NEGATIVE
Leukocytes,UA: NEGATIVE
Nitrite, UA: NEGATIVE
Protein,UA: NEGATIVE
Specific Gravity, UA: <1.005 — ABNORMAL LOW (ref 1.005–1.030)
Urobilinogen, Ur: 0.2 mg/dL (ref 0.2–1.0)
pH, UA: 5 (ref 5.0–7.5)

## 2023-08-29 LAB — MICROSCOPIC EXAMINATION: Bacteria, UA: NONE SEEN

## 2023-08-29 NOTE — Progress Notes (Signed)
 In and Out Catheterization  Patient is present today for a I & O catheterization due to suspected UTI. Patient was cleaned and prepped in a sterile fashion with betadine . A 14FR cath was inserted no complications were noted , 180 ml of urine return was noted, urine was yellow  in color. A clean urine sample was collected for UA . Bladder was drained  And catheter was removed with out difficulty.    Performed by: Brettany Sydney Magallon-Mariche,RMA

## 2023-08-29 NOTE — Telephone Encounter (Signed)
 error

## 2023-08-31 ENCOUNTER — Encounter: Payer: Self-pay | Admitting: Urology

## 2023-09-01 LAB — CULTURE, URINE COMPREHENSIVE

## 2023-09-17 ENCOUNTER — Ambulatory Visit: Payer: Medicare PPO | Admitting: Urology

## 2023-09-17 ENCOUNTER — Encounter: Payer: Self-pay | Admitting: Urology

## 2023-09-17 VITALS — BP 134/81 | HR 91

## 2023-09-17 DIAGNOSIS — R351 Nocturia: Secondary | ICD-10-CM | POA: Diagnosis not present

## 2023-09-17 DIAGNOSIS — R32 Unspecified urinary incontinence: Secondary | ICD-10-CM

## 2023-09-17 LAB — URINALYSIS, COMPLETE
Bilirubin, UA: NEGATIVE
Glucose, UA: NEGATIVE
Ketones, UA: NEGATIVE
Nitrite, UA: NEGATIVE
Protein,UA: NEGATIVE
Specific Gravity, UA: 1.015 (ref 1.005–1.030)
Urobilinogen, Ur: 0.2 mg/dL (ref 0.2–1.0)
pH, UA: 5.5 (ref 5.0–7.5)

## 2023-09-17 LAB — BLADDER SCAN AMB NON-IMAGING: Scan Result: 27

## 2023-09-17 LAB — MICROSCOPIC EXAMINATION

## 2023-09-17 NOTE — Progress Notes (Signed)
 09/17/2023 11:22 AM   Traci [name] 11-21-1932 [id number]  Referring provider: Danella Penton, MD [phone] [facility] [address] [facility] [place],  [place] [address]  Urological history: 1. SUI -contributing factors of age and GSM -hysteropexy, anterior colpography, mid-urethral sling and cysto (2017)   2. rUTI's -contributing factors of age and GSM -documented urine cultures over the last year  July 03, 2023, Klebsiella species  Was given Cipro for possible UTI in October, no culture performed  3. Nocturnal enuresis -contributing factors of age, GSM, diuretics, hypertension, pedal edema, anxiety, depression, lumbar stenosis with neurogenic claudication -Gemtesa 75 mg daily   Chief Complaint  Patient presents with   Follow-up   HPI: Traci [name] is a 89 y.o. female who presents today for been up for several nights with frequent urination and urgency with her daughter, Traci [name]  Previous records reviewed.   At her visit on 07/09/2023, she is having 1-7 daytime urinations, 3 or more episodes of nocturia with strong urge to urinate.  She has urge incontinence.  She leaks 3 more times a day.  She wears 3 absorbent pads daily she does engage in toilet mapping.  She does limit fluid intake.  She states for the last few days she has been having burning with urination and suprapubic pressure.  Patient denies any modifying or aggravating factors.  Patient denies any recent UTI's, gross hematuria, dysuria or suprapubic/flank pain.  Patient denies any fevers, chills, nausea or vomiting.   PVR 70 cc.  UA 11-30 WBCs and many bacteria hyaline cast present.  Urine culture positive for Klebsiella pneumonia.  She has also run out of her prescription of Leslye Peer and has been taking Myrbetriq in its place.  She would like to go back on [place].  She is treated with culture appropriate antibiotics, her Leslye Peer was refilled and she was advised to return in 1 month.  She did  not present for that appointment.  At her visit on 08/29/2023, She is having 1-7 daytime voids, 3 more episodes of nocturia with a strong urge to urinate.  She does says urge incontinence.  She is leaking 3 more times a day she always wears absorbent products for urinary leakage.  She is wearing 3 absorbent pads daily.  She does sometimes limit fluid intake.  She does engage in toilet mapping.   She did not feel that the Gemtesa helped her nocturia at all.   Patient denies any modifying or aggravating factors.  Patient denies any recent UTI's, gross hematuria, dysuria or suprapubic/flank pain.  Patient denies any fevers, chills, nausea or vomiting.  She feels that her nocturia has improved since the last time she was seen by [place], but she has developed worsening incontinence.  Her UA was benign.   Urine culture was negative.    She contacted the office this morning with the complaints of nocturia.  The nocturia has been longstanding.  She states that she has no issues during the day.  At night she is up constantly urinating.  Her PVR today was 27 mL.  Urinalysis was yellow slightly cloudy, specific gravity 1.015, trace heme, pH 5.5, 1+ leukocyte, 11-30 WBCs, 0-2 RBCs, 0-10 epithelial cells and many bacteria.  PMH: Past Medical History:  Diagnosis Date   Acid reflux    Anxiety    Arthritis    Bruises easily    Cancer (HCC) skin   Squamous cell skin cancer   Depression    Headache  H/O YEARS AGO   Hyperlipidemia    Hypertension    Lumbar pain     Surgical History: Past Surgical History:  Procedure Laterality Date   BLADDER SURGERY     Prolapse bladder, bladder sling surgery   CATARACT EXTRACTION     CHOLECYSTECTOMY     HIP ARTHROPLASTY     JOINT REPLACEMENT Bilateral    HIP REPLACMENT/ KNEE REPLACEMENT   OPEN REDUCTION INTERNAL FIXATION (ORIF) DISTAL RADIAL FRACTURE Right 10/07/2017   Procedure: OPEN REDUCTION INTERNAL FIXATION (ORIF) DISTAL RADIAL FRACTURE;  Surgeon: Lyndle Herrlich,  MD;  Location: [facility] ORS;  Service: Orthopedics;  Laterality: Right;    Home Medications:  Allergies as of 09/17/2023       Reactions   Adhesive [tape]    PT HAS VERY SENSITIVE SKIN AND WILL BECOME IRRITATED   Hydrocodone Other (See Comments)   ams        Medication List        Accurate as of September 17, 2023 11:22 AM. If you have any questions, ask your nurse or doctor.          acetaminophen 500 MG tablet Commonly known as: TYLENOL Take 500 mg by mouth every 4 (four) hours.   ALPRAZolam 0.25 MG tablet Commonly known as: XANAX TAKE ONE TABLET BY MOUTH DAILY-AM   amLODipine 10 MG tablet Commonly known as: NORVASC TAKE 0.5 TABLET EACH DAY-AM   cefUROXime 500 MG tablet Commonly known as: CEFTIN Take 1 tablet (500 mg total) by mouth 2 (two) times daily with a meal.   cyanocobalamin 1000 MCG tablet Commonly known as: VITAMIN B12 Take 1,000 mcg by mouth daily.   furosemide 20 MG tablet Commonly known as: LASIX 40 mg daily.   gabapentin 100 MG capsule Commonly known as: NEURONTIN Take 100 mg by mouth daily.   Gemtesa 75 MG Tabs Generic drug: Vibegron Take 1 tablet (75 mg total) by mouth daily.   meloxicam 7.5 MG tablet Commonly known as: MOBIC Take 7.5 mg by mouth daily.   metoCLOPramide 5 MG tablet Commonly known as: REGLAN 1 pill in the morning,1 pill at bedtime   metoprolol succinate 25 MG 24 hr tablet Commonly known as: TOPROL-XL Take 25 mg by mouth daily.   pantoprazole 40 MG tablet Commonly known as: PROTONIX TAKE ONE TABLET BY MOUTH DAILY-AM   pramipexole 0.5 MG tablet Commonly known as: MIRAPEX TAKE ONE TABLET BY MOUTH EVERY NIGHT AT BEDTIME, MAY TAKE EXTRA 1/2 TABLET IF NEEDED.   telmisartan 80 MG tablet Commonly known as: MICARDIS Take 80 mg by mouth every morning.   traMADol 50 MG tablet Commonly known as: ULTRAM   UNABLE TO FIND 1 Dose daily. Med Name: "SUPER CREAM"  MIX 1/2 TUBE OF DESITIN AND 1/2 TUBE NYSTATIN WITH AN ENTIRE  TUB OF AQUAPHOR AND APPLY TO VULVAR AREA   vitamin C 1000 MG tablet Take 1,000 mg by mouth daily.   Vitamin D 50 MCG (2000 UT) tablet Take 2,000 Units by mouth daily.        Allergies:  Allergies  Allergen Reactions   Adhesive [Tape]     PT HAS VERY SENSITIVE SKIN AND WILL BECOME IRRITATED   Hydrocodone Other (See Comments)    ams    Family History: Family History  Problem Relation Age of Onset   [name] disease Brother    AAA (abdominal aortic aneurysm) Neg Hx     Social History:  reports that she has never smoked. She has never used  smokeless tobacco. She reports that she does not drink alcohol and does not use drugs.  ROS: Pertinent ROS in HPI  Physical Exam: BP 134/81   Pulse 91   Constitutional:  Well nourished. Alert and oriented, No acute distress. HEENT: [place] AT, moist mucus membranes.  Trachea midline Cardiovascular: No clubbing, cyanosis, or edema. Respiratory: Normal respiratory effort, no increased work of breathing. Neurologic: Grossly intact, no focal deficits, moving all 4 extremities. Psychiatric: Normal mood and affect.    Laboratory Data: Urinalysis See EPIC and HPI I have reviewed the labs.  Assessment & Plan:    1. Nocturia - I explained to the patient that nocturia is often multi-factorial and difficult to treat.  Sleeping disorders, heart conditions, peripheral vascular disease, diabetes, an enlarged prostate for men, an urethral stricture causing bladder outlet obstruction and/or certain medications can contribute to nocturia. - I have explained that research studies have showed that over 84% of patients with sleep apnea reported frequent nighttime urination.   With sleep apnea, oxygen decreases, carbon dioxide increases, the blood become more acidic, the heart rate drops and blood vessels in the lung constrict.  The body is then alerted that something is very wrong. The sleeper must wake enough to reopen the airway. By this time, the heart is  racing and experiences a false signal of fluid overload. The heart excretes a hormone-like protein that tells the body to get rid of sodium and water, resulting in nocturia. - There is also an increased incidence in sleep apnea with menopause, symptoms include night sweats, daytime sleepiness, depressed mood, and cognitive complaints like poor concentration or problems with short-term memory  - The patient may benefit from a discussion with his or her primary care physician to see if he or she has risk factors for sleep apnea or other sleep disturbances and obtaining a sleep study. -I explained to me the more suspicious of her having sleep apnea as she has no daytime issues and the OAB medications that we have tried have not worked for her -she will contact Dr. Hyacinth Meeker for a sleep study -I also discussed that her anxiety and depression since her husband's passing also may be contributing to her getting up at night  2. Vaginal atrophy -she deferred vaginal estrogen cream because she has been told she has fibrocystic breast disease and to avoid estrogen -Asymptomatic  Return for she will contact PCP for sleep study .  These notes generated with voice recognition software. I apologize for typographical errors.  Cloretta Ned  [facility] [address]  [address] [place], [place] [address] [phone]

## 2024-02-11 ENCOUNTER — Other Ambulatory Visit: Payer: Self-pay | Admitting: Family Medicine

## 2024-02-11 DIAGNOSIS — R410 Disorientation, unspecified: Secondary | ICD-10-CM

## 2024-02-11 DIAGNOSIS — R519 Headache, unspecified: Secondary | ICD-10-CM

## 2024-02-24 ENCOUNTER — Ambulatory Visit
Admission: RE | Admit: 2024-02-24 | Discharge: 2024-02-24 | Disposition: A | Discharge status: Home / Self Care | Source: Ambulatory Visit | Attending: Family Medicine | Admitting: Family Medicine

## 2024-02-24 DIAGNOSIS — R519 Headache, unspecified: Secondary | ICD-10-CM

## 2024-02-24 DIAGNOSIS — R410 Disorientation, unspecified: Secondary | ICD-10-CM

## 2024-02-24 MED ORDER — GADOPICLENOL 0.5 MMOL/ML IV SOLN
7.0000 mL | Freq: Once | INTRAVENOUS | Status: AC | PRN
  Administered 2024-02-24: 7 mL via INTRAVENOUS
